# Patient Record
Sex: Male | Born: 1945 | Race: White | Hispanic: No | Marital: Married | Smoking: Former smoker
Health system: Southern US, Community
[De-identification: ages and names within clinical notes are randomized; demographics above are authoritative.]

## PROBLEM LIST (undated history)

## (undated) DIAGNOSIS — E039 Hypothyroidism, unspecified: Secondary | ICD-10-CM

## (undated) DIAGNOSIS — K219 Gastro-esophageal reflux disease without esophagitis: Secondary | ICD-10-CM

## (undated) HISTORY — PX: OTHER SURGICAL HISTORY: SHX169

---

## 2004-03-27 ENCOUNTER — Ambulatory Visit (HOSPITAL_COMMUNITY)
Admission: RE | Admit: 2004-03-27 | Discharge: 2004-03-27 | Payer: Self-pay | Admitting: Physical Medicine and Rehabilitation

## 2006-05-01 IMAGING — CR DG ORBITS FOR FOREIGN BODY
3 series · 3 of 3 positions shown · non-contrast
Comparison: none

CLINICAL DATA: Patient requires orbital clearance prior to MRI.

 ORBITS FOR FOREIGN BODY (TWO VIEWS):
 There is no evidence of metal or radiopaque foreign body within the orbits. No significant bone or soft tissue abnormalities are identified.

[view not recorded (1 of 3)]
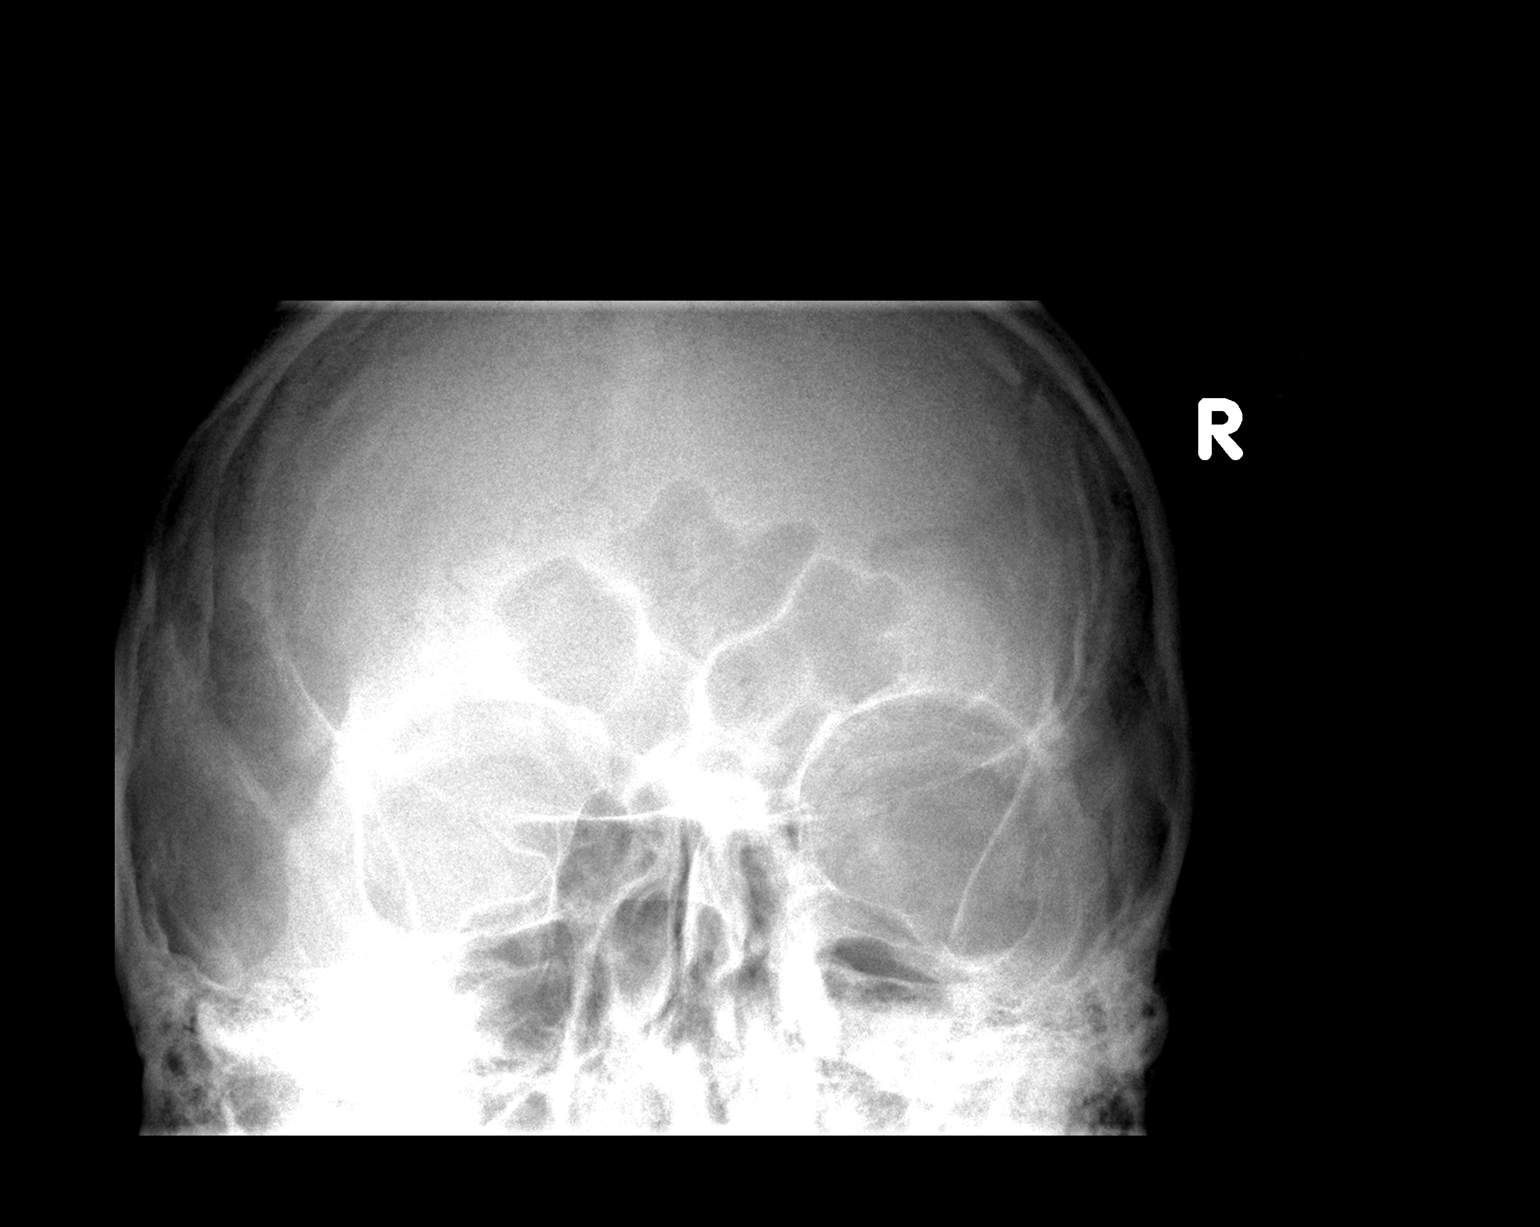

[view not recorded (2 of 3)]
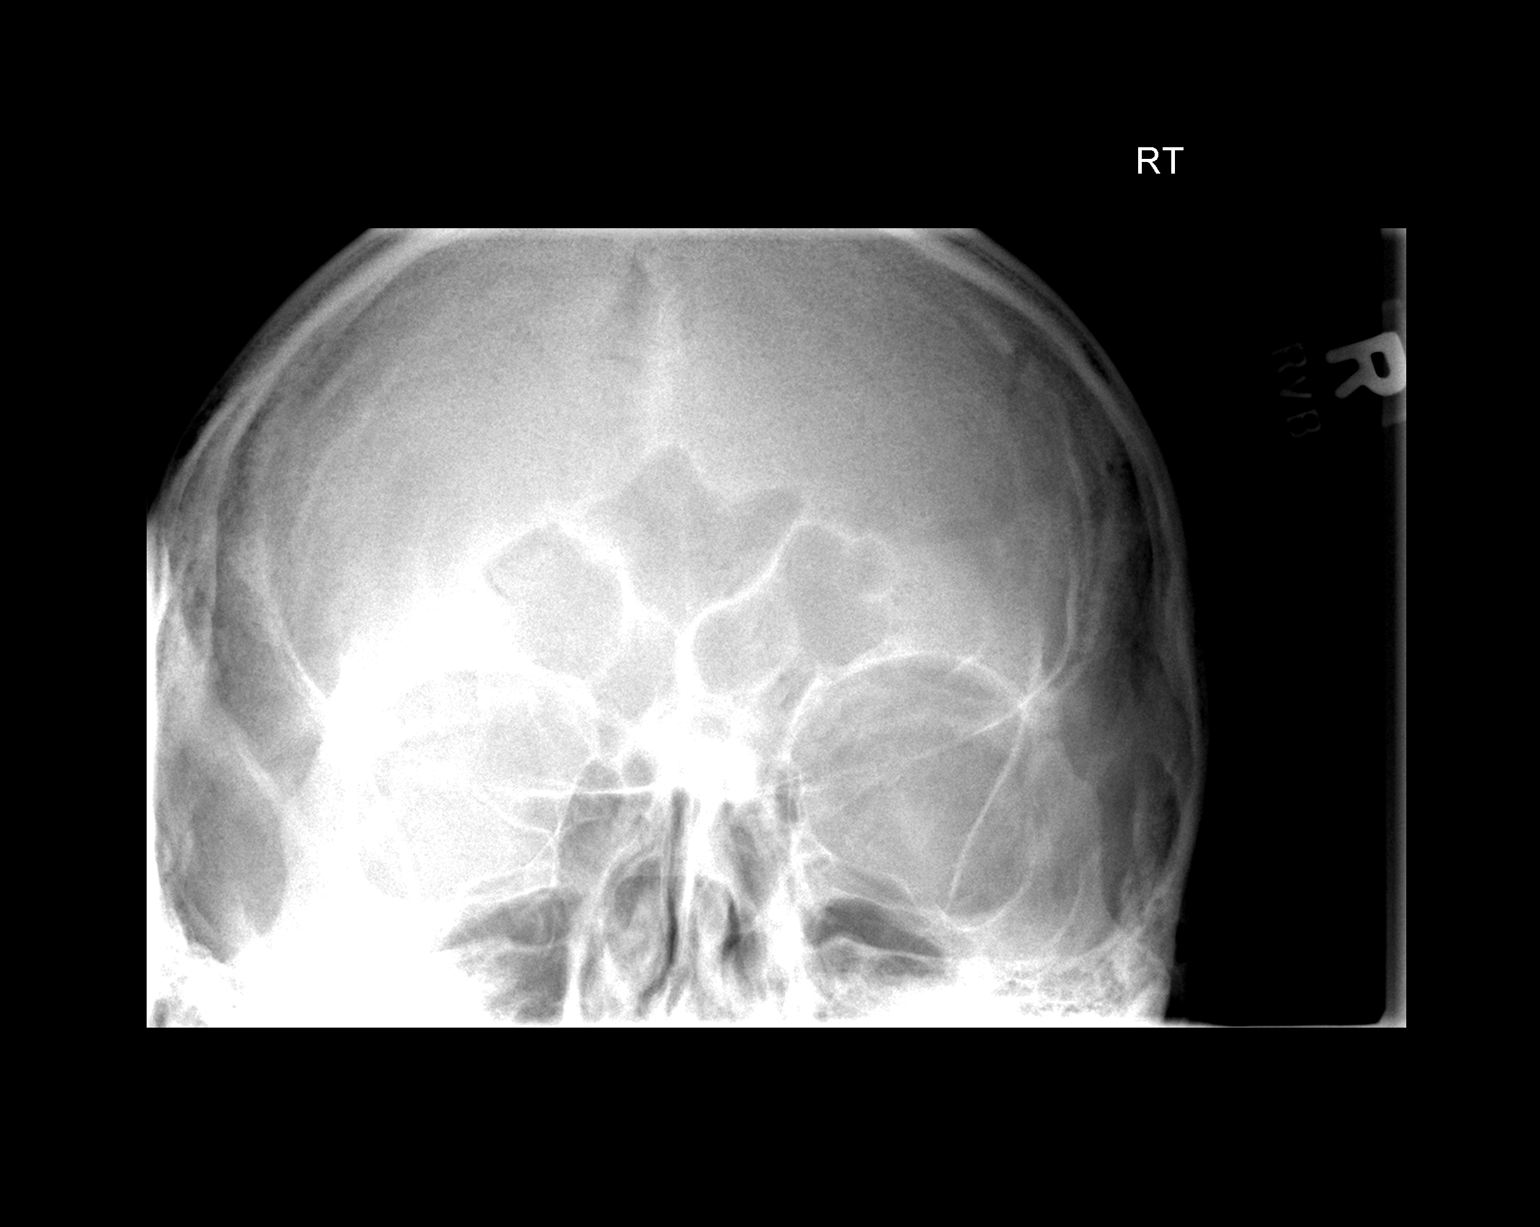

[view not recorded (3 of 3)]
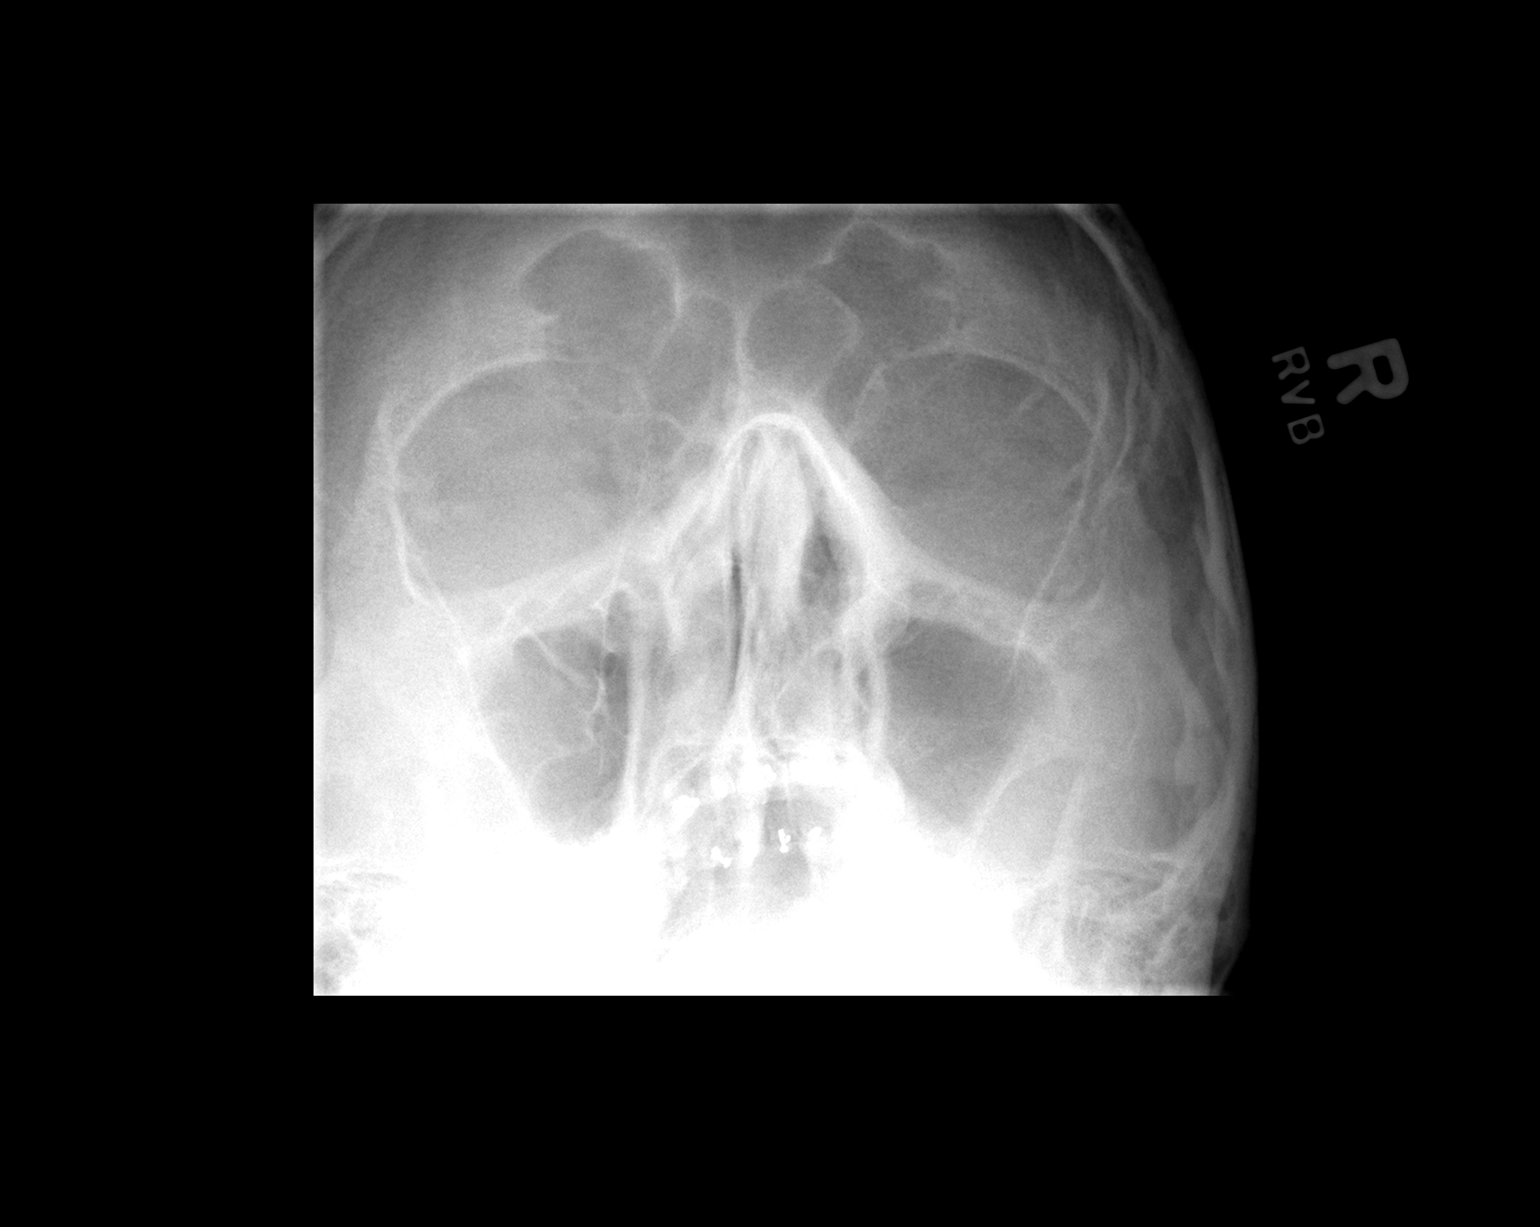

[3 of 3 positions shown; findings below may reference images not displayed]

IMPRESSION: No evidence of metallic foreign body.

## 2014-03-25 DIAGNOSIS — E559 Vitamin D deficiency, unspecified: Secondary | ICD-10-CM

## 2014-03-25 DIAGNOSIS — R059 Cough, unspecified: Secondary | ICD-10-CM

## 2014-03-25 DIAGNOSIS — M159 Polyosteoarthritis, unspecified: Secondary | ICD-10-CM

## 2014-03-25 DIAGNOSIS — Z8619 Personal history of other infectious and parasitic diseases: Secondary | ICD-10-CM

## 2014-03-25 DIAGNOSIS — G8929 Other chronic pain: Secondary | ICD-10-CM | POA: Insufficient documentation

## 2014-03-25 DIAGNOSIS — E039 Hypothyroidism, unspecified: Secondary | ICD-10-CM | POA: Insufficient documentation

## 2014-03-25 DIAGNOSIS — J019 Acute sinusitis, unspecified: Secondary | ICD-10-CM | POA: Insufficient documentation

## 2014-03-25 DIAGNOSIS — K219 Gastro-esophageal reflux disease without esophagitis: Secondary | ICD-10-CM | POA: Insufficient documentation

## 2014-11-04 DIAGNOSIS — G8929 Other chronic pain: Secondary | ICD-10-CM

## 2016-02-18 DIAGNOSIS — B9689 Other specified bacterial agents as the cause of diseases classified elsewhere: Secondary | ICD-10-CM | POA: Diagnosis not present

## 2016-02-18 DIAGNOSIS — J208 Acute bronchitis due to other specified organisms: Secondary | ICD-10-CM | POA: Diagnosis not present

## 2016-02-18 DIAGNOSIS — J309 Allergic rhinitis, unspecified: Secondary | ICD-10-CM | POA: Diagnosis not present

## 2016-02-18 DIAGNOSIS — R0982 Postnasal drip: Secondary | ICD-10-CM | POA: Diagnosis not present

## 2016-02-25 DIAGNOSIS — J329 Chronic sinusitis, unspecified: Secondary | ICD-10-CM | POA: Diagnosis not present

## 2016-07-28 DIAGNOSIS — H5203 Hypermetropia, bilateral: Secondary | ICD-10-CM | POA: Diagnosis not present

## 2016-11-10 DIAGNOSIS — J329 Chronic sinusitis, unspecified: Secondary | ICD-10-CM | POA: Insufficient documentation

## 2016-11-30 DIAGNOSIS — J302 Other seasonal allergic rhinitis: Secondary | ICD-10-CM | POA: Diagnosis not present

## 2016-11-30 DIAGNOSIS — E782 Mixed hyperlipidemia: Secondary | ICD-10-CM | POA: Diagnosis not present

## 2016-11-30 DIAGNOSIS — Z0000 Encounter for general adult medical examination without abnormal findings: Secondary | ICD-10-CM | POA: Diagnosis not present

## 2016-11-30 DIAGNOSIS — K219 Gastro-esophageal reflux disease without esophagitis: Secondary | ICD-10-CM | POA: Diagnosis not present

## 2016-11-30 DIAGNOSIS — E039 Hypothyroidism, unspecified: Secondary | ICD-10-CM | POA: Diagnosis not present

## 2016-11-30 DIAGNOSIS — Z1159 Encounter for screening for other viral diseases: Secondary | ICD-10-CM | POA: Diagnosis not present

## 2017-03-28 DIAGNOSIS — J329 Chronic sinusitis, unspecified: Secondary | ICD-10-CM | POA: Diagnosis not present

## 2017-07-25 DIAGNOSIS — H5203 Hypermetropia, bilateral: Secondary | ICD-10-CM | POA: Diagnosis not present

## 2017-12-01 DIAGNOSIS — Z1211 Encounter for screening for malignant neoplasm of colon: Secondary | ICD-10-CM | POA: Diagnosis not present

## 2017-12-01 DIAGNOSIS — Z125 Encounter for screening for malignant neoplasm of prostate: Secondary | ICD-10-CM | POA: Diagnosis not present

## 2017-12-01 DIAGNOSIS — Z87891 Personal history of nicotine dependence: Secondary | ICD-10-CM | POA: Diagnosis not present

## 2017-12-01 DIAGNOSIS — E782 Mixed hyperlipidemia: Secondary | ICD-10-CM

## 2017-12-01 DIAGNOSIS — J302 Other seasonal allergic rhinitis: Secondary | ICD-10-CM | POA: Diagnosis not present

## 2017-12-01 DIAGNOSIS — Z0000 Encounter for general adult medical examination without abnormal findings: Secondary | ICD-10-CM | POA: Diagnosis not present

## 2017-12-01 DIAGNOSIS — E039 Hypothyroidism, unspecified: Secondary | ICD-10-CM | POA: Diagnosis not present

## 2017-12-01 DIAGNOSIS — K219 Gastro-esophageal reflux disease without esophagitis: Secondary | ICD-10-CM | POA: Diagnosis not present

## 2017-12-26 DIAGNOSIS — J0100 Acute maxillary sinusitis, unspecified: Secondary | ICD-10-CM | POA: Diagnosis not present

## 2018-08-06 DIAGNOSIS — U071 COVID-19: Secondary | ICD-10-CM

## 2018-12-07 DIAGNOSIS — L821 Other seborrheic keratosis: Secondary | ICD-10-CM

## 2018-12-07 DIAGNOSIS — J302 Other seasonal allergic rhinitis: Secondary | ICD-10-CM | POA: Insufficient documentation

## 2019-12-11 DIAGNOSIS — K4090 Unilateral inguinal hernia, without obstruction or gangrene, not specified as recurrent: Secondary | ICD-10-CM | POA: Insufficient documentation

## 2020-02-04 DIAGNOSIS — E782 Mixed hyperlipidemia: Secondary | ICD-10-CM | POA: Diagnosis not present

## 2020-02-04 DIAGNOSIS — R972 Elevated prostate specific antigen [PSA]: Secondary | ICD-10-CM | POA: Diagnosis not present

## 2020-02-04 DIAGNOSIS — E039 Hypothyroidism, unspecified: Secondary | ICD-10-CM | POA: Diagnosis not present

## 2020-02-04 DIAGNOSIS — K219 Gastro-esophageal reflux disease without esophagitis: Secondary | ICD-10-CM | POA: Diagnosis not present

## 2020-02-17 DIAGNOSIS — R140 Abdominal distension (gaseous): Secondary | ICD-10-CM | POA: Diagnosis not present

## 2020-02-17 DIAGNOSIS — K219 Gastro-esophageal reflux disease without esophagitis: Secondary | ICD-10-CM | POA: Diagnosis not present

## 2020-02-17 DIAGNOSIS — K5909 Other constipation: Secondary | ICD-10-CM | POA: Diagnosis not present

## 2020-03-05 DIAGNOSIS — Z1211 Encounter for screening for malignant neoplasm of colon: Secondary | ICD-10-CM | POA: Diagnosis not present

## 2020-03-05 DIAGNOSIS — R1310 Dysphagia, unspecified: Secondary | ICD-10-CM | POA: Diagnosis not present

## 2020-03-16 DIAGNOSIS — R1310 Dysphagia, unspecified: Secondary | ICD-10-CM | POA: Diagnosis not present

## 2020-03-16 DIAGNOSIS — D126 Benign neoplasm of colon, unspecified: Secondary | ICD-10-CM | POA: Diagnosis not present

## 2020-03-16 DIAGNOSIS — K5730 Diverticulosis of large intestine without perforation or abscess without bleeding: Secondary | ICD-10-CM | POA: Diagnosis not present

## 2020-03-16 DIAGNOSIS — Z1211 Encounter for screening for malignant neoplasm of colon: Secondary | ICD-10-CM | POA: Diagnosis not present

## 2020-03-16 DIAGNOSIS — D120 Benign neoplasm of cecum: Secondary | ICD-10-CM | POA: Diagnosis not present

## 2020-08-13 DIAGNOSIS — B370 Candidal stomatitis: Secondary | ICD-10-CM | POA: Insufficient documentation

## 2021-03-30 DIAGNOSIS — Z0000 Encounter for general adult medical examination without abnormal findings: Secondary | ICD-10-CM | POA: Diagnosis not present

## 2021-03-30 DIAGNOSIS — Z125 Encounter for screening for malignant neoplasm of prostate: Secondary | ICD-10-CM | POA: Diagnosis not present

## 2021-03-30 DIAGNOSIS — J302 Other seasonal allergic rhinitis: Secondary | ICD-10-CM | POA: Diagnosis not present

## 2021-03-30 DIAGNOSIS — E782 Mixed hyperlipidemia: Secondary | ICD-10-CM | POA: Diagnosis not present

## 2021-03-30 DIAGNOSIS — K219 Gastro-esophageal reflux disease without esophagitis: Secondary | ICD-10-CM | POA: Diagnosis not present

## 2021-03-30 DIAGNOSIS — E039 Hypothyroidism, unspecified: Secondary | ICD-10-CM | POA: Diagnosis not present

## 2021-04-19 DIAGNOSIS — J301 Allergic rhinitis due to pollen: Secondary | ICD-10-CM | POA: Diagnosis not present

## 2021-04-28 DIAGNOSIS — J019 Acute sinusitis, unspecified: Secondary | ICD-10-CM | POA: Diagnosis not present

## 2021-04-28 DIAGNOSIS — J029 Acute pharyngitis, unspecified: Secondary | ICD-10-CM | POA: Diagnosis not present

## 2021-07-02 DIAGNOSIS — S80861A Insect bite (nonvenomous), right lower leg, initial encounter: Secondary | ICD-10-CM | POA: Diagnosis not present

## 2021-08-05 DIAGNOSIS — S80861A Insect bite (nonvenomous), right lower leg, initial encounter: Secondary | ICD-10-CM | POA: Diagnosis not present

## 2021-12-10 DIAGNOSIS — J069 Acute upper respiratory infection, unspecified: Secondary | ICD-10-CM | POA: Diagnosis not present

## 2022-01-05 DIAGNOSIS — J069 Acute upper respiratory infection, unspecified: Secondary | ICD-10-CM | POA: Diagnosis not present

## 2022-01-05 DIAGNOSIS — J349 Unspecified disorder of nose and nasal sinuses: Secondary | ICD-10-CM | POA: Diagnosis not present

## 2022-02-02 DIAGNOSIS — K4090 Unilateral inguinal hernia, without obstruction or gangrene, not specified as recurrent: Secondary | ICD-10-CM | POA: Diagnosis not present

## 2022-02-11 DIAGNOSIS — Z79899 Other long term (current) drug therapy: Secondary | ICD-10-CM | POA: Diagnosis not present

## 2022-02-11 DIAGNOSIS — K4090 Unilateral inguinal hernia, without obstruction or gangrene, not specified as recurrent: Secondary | ICD-10-CM | POA: Diagnosis not present

## 2022-02-11 DIAGNOSIS — E039 Hypothyroidism, unspecified: Secondary | ICD-10-CM | POA: Diagnosis not present

## 2022-02-11 DIAGNOSIS — G8918 Other acute postprocedural pain: Secondary | ICD-10-CM | POA: Diagnosis not present

## 2022-02-11 DIAGNOSIS — K219 Gastro-esophageal reflux disease without esophagitis: Secondary | ICD-10-CM | POA: Diagnosis not present

## 2022-03-02 DIAGNOSIS — Z09 Encounter for follow-up examination after completed treatment for conditions other than malignant neoplasm: Secondary | ICD-10-CM | POA: Insufficient documentation

## 2022-03-22 DIAGNOSIS — E782 Mixed hyperlipidemia: Secondary | ICD-10-CM | POA: Diagnosis not present

## 2022-03-22 DIAGNOSIS — Z125 Encounter for screening for malignant neoplasm of prostate: Secondary | ICD-10-CM | POA: Diagnosis not present

## 2022-03-22 DIAGNOSIS — E039 Hypothyroidism, unspecified: Secondary | ICD-10-CM | POA: Diagnosis not present

## 2022-03-22 DIAGNOSIS — K219 Gastro-esophageal reflux disease without esophagitis: Secondary | ICD-10-CM | POA: Diagnosis not present

## 2022-03-22 DIAGNOSIS — H6123 Impacted cerumen, bilateral: Secondary | ICD-10-CM | POA: Diagnosis not present

## 2022-04-13 DIAGNOSIS — L299 Pruritus, unspecified: Secondary | ICD-10-CM | POA: Diagnosis not present

## 2022-04-13 DIAGNOSIS — L820 Inflamed seborrheic keratosis: Secondary | ICD-10-CM | POA: Diagnosis not present

## 2022-04-13 DIAGNOSIS — L814 Other melanin hyperpigmentation: Secondary | ICD-10-CM | POA: Diagnosis not present

## 2022-06-02 DIAGNOSIS — I2089 Other forms of angina pectoris: Secondary | ICD-10-CM | POA: Diagnosis not present

## 2022-06-02 DIAGNOSIS — K219 Gastro-esophageal reflux disease without esophagitis: Secondary | ICD-10-CM | POA: Diagnosis not present

## 2022-06-02 DIAGNOSIS — R9431 Abnormal electrocardiogram [ECG] [EKG]: Secondary | ICD-10-CM | POA: Diagnosis not present

## 2022-06-02 DIAGNOSIS — R1013 Epigastric pain: Secondary | ICD-10-CM | POA: Diagnosis not present

## 2022-06-02 DIAGNOSIS — Z7982 Long term (current) use of aspirin: Secondary | ICD-10-CM | POA: Diagnosis not present

## 2022-06-02 DIAGNOSIS — M1990 Unspecified osteoarthritis, unspecified site: Secondary | ICD-10-CM | POA: Diagnosis not present

## 2022-06-02 DIAGNOSIS — Z87891 Personal history of nicotine dependence: Secondary | ICD-10-CM | POA: Diagnosis not present

## 2022-06-02 DIAGNOSIS — E039 Hypothyroidism, unspecified: Secondary | ICD-10-CM | POA: Diagnosis not present

## 2022-06-02 DIAGNOSIS — Z79899 Other long term (current) drug therapy: Secondary | ICD-10-CM | POA: Diagnosis not present

## 2022-06-02 DIAGNOSIS — E785 Hyperlipidemia, unspecified: Secondary | ICD-10-CM | POA: Diagnosis not present

## 2022-06-02 DIAGNOSIS — R079 Chest pain, unspecified: Secondary | ICD-10-CM | POA: Diagnosis not present

## 2022-06-02 DIAGNOSIS — I493 Ventricular premature depolarization: Secondary | ICD-10-CM | POA: Diagnosis not present

## 2022-06-03 ENCOUNTER — Inpatient Hospital Stay (HOSPITAL_COMMUNITY)
Admission: AD | Admit: 2022-06-03 | Discharge: 2022-06-05 | DRG: 322 | Disposition: A | Discharge status: Home / Self Care | Payer: Medicare HMO | Source: Other Acute Inpatient Hospital | Attending: Cardiology | Admitting: Cardiology

## 2022-06-03 ENCOUNTER — Encounter (HOSPITAL_COMMUNITY)
Admission: AD | Disposition: A | Discharge status: Home / Self Care | Payer: Self-pay | Source: Other Acute Inpatient Hospital | Attending: Cardiology

## 2022-06-03 ENCOUNTER — Encounter (HOSPITAL_COMMUNITY): Payer: Self-pay | Admitting: Cardiology

## 2022-06-03 DIAGNOSIS — I472 Ventricular tachycardia, unspecified: Secondary | ICD-10-CM | POA: Diagnosis not present

## 2022-06-03 DIAGNOSIS — Z7982 Long term (current) use of aspirin: Secondary | ICD-10-CM | POA: Diagnosis not present

## 2022-06-03 DIAGNOSIS — N183 Chronic kidney disease, stage 3 unspecified: Secondary | ICD-10-CM | POA: Diagnosis not present

## 2022-06-03 DIAGNOSIS — E039 Hypothyroidism, unspecified: Secondary | ICD-10-CM | POA: Diagnosis present

## 2022-06-03 DIAGNOSIS — R0789 Other chest pain: Secondary | ICD-10-CM | POA: Diagnosis not present

## 2022-06-03 DIAGNOSIS — R1013 Epigastric pain: Secondary | ICD-10-CM | POA: Diagnosis not present

## 2022-06-03 DIAGNOSIS — I951 Orthostatic hypotension: Secondary | ICD-10-CM | POA: Diagnosis not present

## 2022-06-03 DIAGNOSIS — Z955 Presence of coronary angioplasty implant and graft: Principal | ICD-10-CM

## 2022-06-03 DIAGNOSIS — I200 Unstable angina: Secondary | ICD-10-CM

## 2022-06-03 DIAGNOSIS — R079 Chest pain, unspecified: Secondary | ICD-10-CM | POA: Diagnosis not present

## 2022-06-03 DIAGNOSIS — R9439 Abnormal result of other cardiovascular function study: Secondary | ICD-10-CM | POA: Diagnosis present

## 2022-06-03 DIAGNOSIS — R55 Syncope and collapse: Secondary | ICD-10-CM

## 2022-06-03 DIAGNOSIS — Z79899 Other long term (current) drug therapy: Secondary | ICD-10-CM | POA: Diagnosis not present

## 2022-06-03 DIAGNOSIS — K219 Gastro-esophageal reflux disease without esophagitis: Secondary | ICD-10-CM | POA: Diagnosis present

## 2022-06-03 DIAGNOSIS — I209 Angina pectoris, unspecified: Secondary | ICD-10-CM | POA: Diagnosis not present

## 2022-06-03 DIAGNOSIS — E785 Hyperlipidemia, unspecified: Secondary | ICD-10-CM | POA: Diagnosis not present

## 2022-06-03 DIAGNOSIS — I255 Ischemic cardiomyopathy: Secondary | ICD-10-CM | POA: Diagnosis not present

## 2022-06-03 DIAGNOSIS — I25110 Atherosclerotic heart disease of native coronary artery with unstable angina pectoris: Secondary | ICD-10-CM | POA: Diagnosis not present

## 2022-06-03 DIAGNOSIS — Z87891 Personal history of nicotine dependence: Secondary | ICD-10-CM

## 2022-06-03 DIAGNOSIS — I493 Ventricular premature depolarization: Secondary | ICD-10-CM | POA: Diagnosis present

## 2022-06-03 DIAGNOSIS — M1990 Unspecified osteoarthritis, unspecified site: Secondary | ICD-10-CM | POA: Diagnosis not present

## 2022-06-03 DIAGNOSIS — Z8249 Family history of ischemic heart disease and other diseases of the circulatory system: Secondary | ICD-10-CM

## 2022-06-03 DIAGNOSIS — I2089 Other forms of angina pectoris: Secondary | ICD-10-CM | POA: Diagnosis not present

## 2022-06-03 HISTORY — PX: CORONARY STENT INTERVENTION: CATH118234

## 2022-06-03 HISTORY — PX: LEFT HEART CATH AND CORONARY ANGIOGRAPHY: CATH118249

## 2022-06-03 HISTORY — DX: Gastro-esophageal reflux disease without esophagitis: K21.9

## 2022-06-03 HISTORY — DX: Hypothyroidism, unspecified: E03.9

## 2022-06-03 LAB — CBC
HCT: 40.5 % (ref 39.0–52.0)
Hemoglobin: 13.8 g/dL (ref 13.0–17.0)
MCH: 29.1 pg (ref 26.0–34.0)
MCHC: 34.1 g/dL (ref 30.0–36.0)
MCV: 85.4 fL (ref 80.0–100.0)
Platelets: 213 10*3/uL (ref 150–400)
RBC: 4.74 MIL/uL (ref 4.22–5.81)
RDW: 11.7 % (ref 11.5–15.5)
WBC: 8 10*3/uL (ref 4.0–10.5)
nRBC: 0 % (ref 0.0–0.2)

## 2022-06-03 LAB — CREATININE, SERUM
Creatinine, Ser: 1.25 mg/dL — ABNORMAL HIGH (ref 0.61–1.24)
GFR, Estimated: >60 mL/min (ref >60)

## 2022-06-03 LAB — POCT ACTIVATED CLOTTING TIME: Activated Clotting Time: 309 seconds

## 2022-06-03 LAB — GLUCOSE, CAPILLARY: Glucose-Capillary: 84 mg/dL (ref 70–99)

## 2022-06-03 SURGERY — LEFT HEART CATH AND CORONARY ANGIOGRAPHY
Anesthesia: LOCAL

## 2022-06-03 MED ORDER — MIDAZOLAM HCL 2 MG/2ML IJ SOLN
INTRAMUSCULAR | Status: AC
  Filled 2022-06-03: qty 2

## 2022-06-03 MED ORDER — LIP MEDEX EX OINT
TOPICAL_OINTMENT | CUTANEOUS | Status: DC | PRN
  Filled 2022-06-03: qty 7

## 2022-06-03 MED ORDER — SODIUM CHLORIDE 0.9% FLUSH
3.0000 mL | Freq: Two times a day (BID) | INTRAVENOUS | Status: DC
  Administered 2022-06-04 – 2022-06-05 (×2): 3 mL via INTRAVENOUS

## 2022-06-03 MED ORDER — ENOXAPARIN SODIUM 40 MG/0.4ML IJ SOSY
40.0000 mg | PREFILLED_SYRINGE | INTRAMUSCULAR | Status: DC
  Administered 2022-06-04: 40 mg via SUBCUTANEOUS
  Filled 2022-06-03: qty 0.4

## 2022-06-03 MED ORDER — SODIUM CHLORIDE 0.9% FLUSH: 3.0000 mL | INTRAVENOUS | Status: DC | PRN

## 2022-06-03 MED ORDER — TICAGRELOR 90 MG PO TABS
ORAL_TABLET | ORAL | Status: DC | PRN
  Administered 2022-06-03: 180 mg via ORAL

## 2022-06-03 MED ORDER — ONDANSETRON HCL 4 MG/2ML IJ SOLN: 4.0000 mg | Freq: Four times a day (QID) | INTRAMUSCULAR | Status: DC | PRN

## 2022-06-03 MED ORDER — ACETAMINOPHEN 325 MG PO TABS: 650.0000 mg | ORAL_TABLET | ORAL | Status: DC | PRN

## 2022-06-03 MED ORDER — NITROGLYCERIN 0.4 MG SL SUBL: 0.4000 mg | SUBLINGUAL_TABLET | SUBLINGUAL | Status: DC | PRN

## 2022-06-03 MED ORDER — TICAGRELOR 90 MG PO TABS
90.0000 mg | ORAL_TABLET | Freq: Two times a day (BID) | ORAL | Status: DC
  Administered 2022-06-04 – 2022-06-05 (×3): 90 mg via ORAL
  Filled 2022-06-03 (×3): qty 1

## 2022-06-03 MED ORDER — TICAGRELOR 90 MG PO TABS
ORAL_TABLET | ORAL | Status: AC
  Filled 2022-06-03: qty 2

## 2022-06-03 MED ORDER — MIDAZOLAM HCL 2 MG/2ML IJ SOLN
INTRAMUSCULAR | Status: DC | PRN
  Administered 2022-06-03: 1 mg via INTRAVENOUS

## 2022-06-03 MED ORDER — METOPROLOL SUCCINATE ER 25 MG PO TB24
25.0000 mg | ORAL_TABLET | Freq: Every day | ORAL | Status: DC
  Administered 2022-06-03 – 2022-06-04 (×2): 25 mg via ORAL
  Filled 2022-06-03 (×2): qty 1

## 2022-06-03 MED ORDER — VERAPAMIL HCL 2.5 MG/ML IV SOLN
INTRAVENOUS | Status: DC | PRN
  Administered 2022-06-03: 10 mL via INTRA_ARTERIAL

## 2022-06-03 MED ORDER — NITROGLYCERIN 1 MG/10 ML FOR IR/CATH LAB
INTRA_ARTERIAL | Status: AC
  Filled 2022-06-03: qty 10

## 2022-06-03 MED ORDER — ASPIRIN 81 MG PO TBEC
81.0000 mg | DELAYED_RELEASE_TABLET | Freq: Every day | ORAL | Status: DC
  Administered 2022-06-04 – 2022-06-05 (×2): 81 mg via ORAL
  Filled 2022-06-03 (×2): qty 1

## 2022-06-03 MED ORDER — SODIUM CHLORIDE 0.9 % IV SOLN: 250.0000 mL | INTRAVENOUS | Status: DC | PRN

## 2022-06-03 MED ORDER — ASPIRIN 81 MG PO CHEW: 81.0000 mg | CHEWABLE_TABLET | Freq: Every day | ORAL | Status: DC

## 2022-06-03 MED ORDER — SODIUM CHLORIDE 0.9 % WEIGHT BASED INFUSION
1.0000 mL/kg/h | INTRAVENOUS | Status: AC
  Administered 2022-06-03: 1 mL/kg/h via INTRAVENOUS

## 2022-06-03 MED ORDER — HEPARIN SODIUM (PORCINE) 1000 UNIT/ML IJ SOLN
INTRAMUSCULAR | Status: AC
  Filled 2022-06-03: qty 10

## 2022-06-03 MED ORDER — HEPARIN SODIUM (PORCINE) 1000 UNIT/ML IJ SOLN
INTRAMUSCULAR | Status: DC | PRN
  Administered 2022-06-03 (×2): 3500 [IU] via INTRAVENOUS

## 2022-06-03 MED ORDER — FENTANYL CITRATE (PF) 100 MCG/2ML IJ SOLN
INTRAMUSCULAR | Status: AC
  Filled 2022-06-03: qty 2

## 2022-06-03 MED ORDER — IOHEXOL 350 MG/ML SOLN
INTRAVENOUS | Status: DC | PRN
  Administered 2022-06-03: 100 mL

## 2022-06-03 MED ORDER — LIDOCAINE HCL (PF) 1 % IJ SOLN
INTRAMUSCULAR | Status: DC | PRN
  Administered 2022-06-03: 2 mL

## 2022-06-03 MED ORDER — FENTANYL CITRATE (PF) 100 MCG/2ML IJ SOLN
INTRAMUSCULAR | Status: DC | PRN
  Administered 2022-06-03: 25 ug via INTRAVENOUS

## 2022-06-03 MED ORDER — NITROGLYCERIN 1 MG/10 ML FOR IR/CATH LAB
INTRA_ARTERIAL | Status: DC | PRN
  Administered 2022-06-03: 200 ug via INTRACORONARY

## 2022-06-03 MED ORDER — VERAPAMIL HCL 2.5 MG/ML IV SOLN
INTRAVENOUS | Status: AC
  Filled 2022-06-03: qty 6

## 2022-06-03 MED ORDER — LIDOCAINE HCL (PF) 1 % IJ SOLN
INTRAMUSCULAR | Status: AC
  Filled 2022-06-03: qty 30

## 2022-06-03 MED ORDER — HEPARIN (PORCINE) IN NACL 1000-0.9 UT/500ML-% IV SOLN
INTRAVENOUS | Status: DC | PRN
  Administered 2022-06-03 (×2): 500 mL

## 2022-06-03 MED ORDER — ATORVASTATIN CALCIUM 80 MG PO TABS
80.0000 mg | ORAL_TABLET | Freq: Every day | ORAL | Status: DC
  Administered 2022-06-03 – 2022-06-05 (×3): 80 mg via ORAL
  Filled 2022-06-03 (×3): qty 1

## 2022-06-03 SURGICAL SUPPLY — 18 items
BALLN EMERGE MR 2.5X12 (BALLOONS) ×1
BALLN ~~LOC~~ EMERGE MR 3.0X8 (BALLOONS) ×1
BALLOON EMERGE MR 2.5X12 (BALLOONS) IMPLANT
BALLOON ~~LOC~~ EMERGE MR 3.0X8 (BALLOONS) IMPLANT
CATH 5FR JL3.5 JR4 ANG PIG MP (CATHETERS) IMPLANT
CATH VISTA GUIDE 6FR XBLAD3.5 (CATHETERS) IMPLANT
DEVICE RAD TR BAND REGULAR (VASCULAR PRODUCTS) IMPLANT
GLIDESHEATH SLEND SS 6F .021 (SHEATH) IMPLANT
GUIDEWIRE INQWIRE 1.5J.035X260 (WIRE) IMPLANT
INQWIRE 1.5J .035X260CM (WIRE) ×1
KIT ENCORE 26 ADVANTAGE (KITS) IMPLANT
KIT HEART LEFT (KITS) ×1 IMPLANT
PACK CARDIAC CATHETERIZATION (CUSTOM PROCEDURE TRAY) ×1 IMPLANT
STENT SYNERGY XD 3.0X12 (Permanent Stent) IMPLANT
SYNERGY XD 3.0X12 (Permanent Stent) ×1 IMPLANT
TRANSDUCER W/STOPCOCK (MISCELLANEOUS) ×1 IMPLANT
TUBING CIL FLEX 10 FLL-RA (TUBING) ×1 IMPLANT
WIRE ASAHI PROWATER 180CM (WIRE) IMPLANT

## 2022-06-03 NOTE — Significant Event (Signed)
Rapid Response Event Note   Reason for Call :  Syncopy while in bathroom.  Per RN, pt walked to the bathroom, urinated, then got dizzy and passed out. Pt was lowered to the floor. Per RN, he did not fall or hit his head.  Initial Focused Assessment:  Pt lying in bed in trendelenburg, in no visible distress. He is neuro intact. Pt denies CP/SOB but does c/o being hot and dizzy. Pupils 3, equal, and brisk. Lungs clear t/o. Skin cool to touch  VS once back in bed: HR-64, BP-118/64, RR-18, 99% on RA  Telemetry reviewed. Pt became bradycardic with HR-30(SB) during episode.   Interventions:  CBG-84 EKG-NSR, nonspecific T wave abnormality. Plan of Care:  ?orthostatic hypotension vs vagal? Pt currently back to baseline. Await MD call back. Please call RRT if further assistance needed.    Event Summary:   MD Notified: Dr. Hector Brunswick notified by bedside RN. Call Time:2334 Arrival [id number] End [id number]  Terrilyn Saver, RN

## 2022-06-03 NOTE — H&P (Signed)
Cardiology Admission History and Physical   Patient ID: Gregory Gibbs MRN: [id number]; DOB: Jul 04, 1945   Admission date: 06/03/2022  PCP:  Keturah Barre, MD   [facility] Providers Cardiologist:  [facility] to [facility] (seen by Dr.  Rosezella Florida at [place])   Chief Complaint:  Exertional chest pain  Patient Profile:   Gregory Gibbs is a 77 y.o. male with hypothyroidism and GERD who is being transferred from [facility] on 06/03/2022 for exertional chest pain with positive treadmill myoview.  History of Present Illness:   Gregory Gibbs is a 77 year old male with past medical history of hypothyroidism and GERD who presented to urgent care on 5/30 with complaint of epigastric burning sensation.  Symptom has been occurring intermittently for the past 8 days with exertion and relieved by rest.  He also endorses worsening dyspnea on exertion.  Symptoms feels different from his usual acid reflux symptom.  He was subsequently sent to [facility].  Initial chest x-ray was normal.  Blood pressure on arrival 135/74.  O2 saturation 98% on room air.  Pulse 71 bpm.  Blood work showed white blood cell count 6.4, hemoglobin 13.9, platelet 280.  Sodium 142, potassium 4.2, creatinine 1.20.  EKG showed sinus rhythm with nonspecific changes.  Initial troponin I < 0.01, second troponin 0.02.  proBNP 610.  Symptom was concerning for angina.  Subsequent treadmill exercise stress test obtained on 06/03/2022 revealed possible EKG changes with ST depression and a downsloping in the inferolateral leads, moderate size region of reversible ischemia in the anterior septal wall, EF 45%, overall high risk study.  Cardiology service was consulted.  Patient was seen by Dr. Rosezella Florida on 5/31, patient was suspected to have LAD lesion.  Given typical anginal pattern, he was transferred to [facility] for cardiac catheterization.  Lipid panel obtained at [facility] showed a total  cholesterol 199, triglyceride 118, HDL 46, LDL 129.  Note, creatinine did increase to 1.4 on 06/03/2022.   Past Medical History:  Diagnosis Date   GERD (gastroesophageal reflux disease)    Hypothyroidism     Past Surgical History:  Procedure Laterality Date   left inguinal hernia repair Left    2024     Medications Prior to Admission: Prior to Admission medications   Not on File     Allergies:   No Known Allergies  Social History:   Social History   Socioeconomic History   Marital status: Married    Spouse name: Not on file   Number of children: Not on file   Years of education: Not on file   Highest education level: Not on file  Occupational History   Not on file  Tobacco Use   Smoking status: Former    Types: Cigarettes   Smokeless tobacco: Never   Tobacco comments:    Quit in 1977, smoked a total of 12 years  Substance and Sexual Activity   Alcohol use: Never   Drug use: Never   Sexual activity: Not on file  Other Topics Concern   Not on file  Social History Narrative   Not on file   Social Determinants of Health   Financial Resource Strain: Not on file  Food Insecurity: Not on file  Transportation Needs: Not on file  Physical Activity: Not on file  Stress: Not on file  Social Connections: Not on file  Intimate Partner Violence: Not on file    Family History:   The patient's family history includes  Atrial fibrillation in his sister; Dementia in his father; Heart failure in his mother.    ROS:  Please see the history of present illness.  All other ROS reviewed and negative.     Physical Exam/Data:  There were no vitals filed for this visit. No intake or output data in the 24 hours ending 06/03/22 1653     No data to display           There is no height or weight on file to calculate BMI.  General:  Well nourished, well developed, in no acute distress HEENT: normal Neck: no JVD Vascular: No carotid bruits; Distal pulses 2+ bilaterally    Cardiac:  normal S1, S2; RRR; no murmur  Lungs:  clear to auscultation bilaterally, no wheezing, rhonchi or rales  Abd: soft, nontender, no hepatomegaly  Ext: no edema Musculoskeletal:  No deformities, BUE and BLE strength normal and equal Skin: warm and dry  Neuro:  CNs 2-12 intact, no focal abnormalities noted Psych:  Normal affect    EKG:  The ECG that was done and was personally reviewed and demonstrates NSR with poor R wave progression in the anterior leads  Relevant CV Studies:  Treadmill myoview 06/03/2022 at [facility] ST depression in the inferolateral leads with stresss, perfusion study showed moderate size region of reversible ischemia in the anterior septal wall, EF 45%, overall high risk study  Laboratory Data:  High Sensitivity Troponin:  No results for input(s): "TROPONINIHS" in the last 720 hours.    ChemistryNo results for input(s): "NA", "K", "CL", "CO2", "GLUCOSE", "BUN", "CREATININE", "CALCIUM", "MG", "GFRNONAA", "GFRAA", "ANIONGAP" in the last 168 hours.  No results for input(s): "PROT", "ALBUMIN", "AST", "ALT", "ALKPHOS", "BILITOT" in the last 168 hours. Lipids No results for input(s): "CHOL", "TRIG", "HDL", "LABVLDL", "LDLCALC", "CHOLHDL" in the last 168 hours. HematologyNo results for input(s): "WBC", "RBC", "HGB", "HCT", "MCV", "MCH", "MCHC", "RDW", "PLT" in the last 168 hours. Thyroid No results for input(s): "TSH", "FREET4" in the last 168 hours. BNPNo results for input(s): "BNP", "PROBNP" in the last 168 hours.  DDimer No results for input(s): "DDIMER" in the last 168 hours.   Radiology/Studies:  No results found.   Assessment and Plan:   Exertional chest pain  - 2 week onset of exertional chest pain, Serial trop < 0.01 --> 0.02 --> 0.02  - treadmill myoview on 06/03/2022 at [facility] showed ST depression in the inferolateral leads with stresss, perfusion study showed moderate size region of reversible ischemia in the anterior septal  wall, EF 45%, overall high risk study  - plan to proceed with cardiac catheterization at [facility]  Hyperlipidemia: total cholesterol 199, triglyceride 118, HDL 46, LDL 129 at [place], high intensity statin.    CKD stage III: Cr 1.20 --> 1.40. Hydrate, repeat BMET in AM   Risk Assessment/Risk Scores:    TIMI Risk Score for Unstable Angina or Non-ST Elevation MI:   The patient's TIMI risk score is 4, which indicates a 20% risk of all cause mortality, new or recurrent myocardial infarction or need for urgent revascularization in the next 14 days.       Severity of Illness: The appropriate patient status for this patient is OBSERVATION. Observation status is judged to be reasonable and necessary in order to provide the required intensity of service to ensure the patient's safety. The patient's presenting symptoms, physical exam findings, and initial radiographic and laboratory data in the context of their medical condition is felt to place them at decreased  risk for further clinical deterioration. Furthermore, it is anticipated that the patient will be medically stable for discharge from the hospital within 2 midnights of admission.    For questions or updates, please contact [facility] Please consult [email or web address] for contact info under     Ramond Dial, [place]  06/03/2022 4:53 PM

## 2022-06-04 ENCOUNTER — Inpatient Hospital Stay (HOSPITAL_COMMUNITY): Payer: Medicare HMO

## 2022-06-04 DIAGNOSIS — R55 Syncope and collapse: Secondary | ICD-10-CM

## 2022-06-04 DIAGNOSIS — I200 Unstable angina: Secondary | ICD-10-CM | POA: Diagnosis not present

## 2022-06-04 DIAGNOSIS — R079 Chest pain, unspecified: Secondary | ICD-10-CM

## 2022-06-04 HISTORY — DX: Syncope and collapse: R55

## 2022-06-04 LAB — CBC
HCT: 40.6 % (ref 39.0–52.0)
Hemoglobin: 13.9 g/dL (ref 13.0–17.0)
MCH: 29.9 pg (ref 26.0–34.0)
MCHC: 34.2 g/dL (ref 30.0–36.0)
MCV: 87.3 fL (ref 80.0–100.0)
Platelets: 217 10*3/uL (ref 150–400)
RBC: 4.65 MIL/uL (ref 4.22–5.81)
RDW: 11.9 % (ref 11.5–15.5)
WBC: 10.4 10*3/uL (ref 4.0–10.5)
nRBC: 0 % (ref 0.0–0.2)

## 2022-06-04 LAB — BASIC METABOLIC PANEL
Anion gap: 8 (ref 5–15)
BUN: 13 mg/dL (ref 8–23)
CO2: 24 mmol/L (ref 22–32)
Calcium: 8.6 mg/dL — ABNORMAL LOW (ref 8.9–10.3)
Chloride: 105 mmol/L (ref 98–111)
Creatinine, Ser: 1.26 mg/dL — ABNORMAL HIGH (ref 0.61–1.24)
GFR, Estimated: 59 mL/min — ABNORMAL LOW (ref >60)
Glucose, Bld: 83 mg/dL (ref 70–99)
Potassium: 3.8 mmol/L (ref 3.5–5.1)
Sodium: 137 mmol/L (ref 135–145)

## 2022-06-04 LAB — LIPID PANEL
Cholesterol: 215 mg/dL — ABNORMAL HIGH (ref 0–200)
HDL: 49 mg/dL (ref >40)
LDL Cholesterol: 149 mg/dL — ABNORMAL HIGH (ref 0–99)
Total CHOL/HDL Ratio: 4.4 RATIO
Triglycerides: 87 mg/dL (ref <150)
VLDL: 17 mg/dL (ref 0–40)

## 2022-06-04 LAB — ECHOCARDIOGRAM COMPLETE
Area-P 1/2: 3.48 cm2
Calc EF: 48.6 %
S' Lateral: 3.7 cm
Single Plane A2C EF: 47.5 %
Single Plane A4C EF: 50.5 %
Weight: 2496 oz

## 2022-06-04 NOTE — Plan of Care (Signed)
  Problem: Education: Goal: Understanding of CV disease, CV risk reduction, and recovery process will improve Outcome: Progressing Goal: Individualized Educational Video(s) Outcome: Progressing   Problem: Activity: Goal: Ability to return to baseline activity level will improve Outcome: Progressing   Problem: Cardiovascular: Goal: Ability to achieve and maintain adequate cardiovascular perfusion will improve Outcome: Progressing Goal: Vascular access site(s) Level 0-1 will be maintained Outcome: Progressing   Problem: Health Behavior/Discharge Planning: Goal: Ability to safely manage health-related needs after discharge will improve Outcome: Progressing   Problem: Education: Goal: Understanding of cardiac disease, CV risk reduction, and recovery process will improve Outcome: Progressing Goal: Individualized Educational Video(s) Outcome: Progressing   Problem: Activity: Goal: Ability to tolerate increased activity will improve Outcome: Progressing   Problem: Cardiac: Goal: Ability to achieve and maintain adequate cardiovascular perfusion will improve Outcome: Progressing   Problem: Health Behavior/Discharge Planning: Goal: Ability to safely manage health-related needs after discharge will improve Outcome: Progressing   Problem: Education: Goal: Knowledge of General Education information will improve Description: Including pain rating scale, medication(s)/side effects and non-pharmacologic comfort measures Outcome: Progressing   Problem: Health Behavior/Discharge Planning: Goal: Ability to manage health-related needs will improve Outcome: Progressing   Problem: Clinical Measurements: Goal: Ability to maintain clinical measurements within normal limits will improve Outcome: Progressing Goal: Will remain free from infection Outcome: Progressing Goal: Diagnostic test results will improve Outcome: Progressing Goal: Respiratory complications will improve Outcome:  Progressing Goal: Cardiovascular complication will be avoided Outcome: Progressing   Problem: Activity: Goal: Risk for activity intolerance will decrease Outcome: Progressing   Problem: Nutrition: Goal: Adequate nutrition will be maintained Outcome: Progressing   Problem: Coping: Goal: Level of anxiety will decrease Outcome: Progressing   Problem: Elimination: Goal: Will not experience complications related to bowel motility Outcome: Progressing Goal: Will not experience complications related to urinary retention Outcome: Progressing   Problem: Pain Managment: Goal: General experience of comfort will improve Outcome: Progressing   Problem: Safety: Goal: Ability to remain free from injury will improve Outcome: Progressing   Problem: Skin Integrity: Goal: Risk for impaired skin integrity will decrease Outcome: Progressing   

## 2022-06-04 NOTE — Progress Notes (Signed)
Echo attempted at 1030, PT just starting to work with patient. Will reattempt as schedule permits [employer]

## 2022-06-04 NOTE — Progress Notes (Addendum)
Assisted pt to the bathroom. Pt then suddenly felt unwell, dizzy and passed out. Held pt on both arms and helped lowered to the floor. Pt looked pale, HR was 30's on the monitor at that time. Called for help, pt became responsive and was then helped to the chair then back to bed. VS taken BP were 118/64, HR 64 02sat 99% when he got back to bed. Rapid response was called. Dr On call was paged. EKG done.   Patient did not want to notify his wife or family members after incident. Dr ordered to get orthostatic VS when able  Orthostatic VS for the past 24 hrs (Last 3 readings):  BP- Lying Pulse- Lying BP- Sitting Pulse- Sitting BP- Standing at 0 minutes Pulse- Standing at 0 minutes BP- Standing at 3 minutes Pulse- Standing at 3 minutes  06/04/22 0501 123/79 88 113/76 88 109/64 77 90/72 98

## 2022-06-04 NOTE — Progress Notes (Addendum)
Pt had episode of 9 beats NSVT on tele. Pt was seen awake, alert VS stable. Denies CP, having palpitations, dizziness. Dr on call paged

## 2022-06-04 NOTE — Progress Notes (Signed)
Rounding Note    Patient Name: Gregory Gibbs Date of Encounter: 06/04/2022  [facility] Cardiologist: None   Subjective   BP 132/78, Cr stable at 1.26, hemoglobin 13.9.  Rapid response called overnight, had syncopal episode while in bathroom.  He walked to the bathroom and urinated and felt dizzy and passed out.  He was lowered to the floor, did not fall or hit his head.  Became bradycardic with heart rate 30s during episode.  Denies any chest pain or dyspnea  Inpatient Medications    Scheduled Meds:  aspirin EC  81 mg Oral Daily   atorvastatin  80 mg Oral Daily   enoxaparin (LOVENOX) injection  40 mg Subcutaneous Q24H   metoprolol succinate  25 mg Oral Daily   sodium chloride flush  3 mL Intravenous Q12H   ticagrelor  90 mg Oral BID   Continuous Infusions:  sodium chloride     PRN Meds: sodium chloride, acetaminophen, lip balm, nitroGLYCERIN, ondansetron (ZOFRAN) IV, sodium chloride flush   Vital Signs    Vitals:   06/03/22 2334 06/03/22 2351 06/04/22 0501 06/04/22 0734  BP: 118/64 114/68 123/79 132/78  Pulse:  60 88   Resp:  18  18  Temp:  (!) 97.5 F (36.4 C)  (!) 97.5 F (36.4 C)  TempSrc:  Oral  Oral  SpO2:  99% 99%   Weight:        Intake/Output Summary (Last 24 hours) at 06/04/2022 0939 Last data filed at 06/04/2022 0444 Gross per 24 hour  Intake 591.18 ml  Output --  Net 591.18 ml      06/03/2022    5:02 PM  Last 3 Weights  Weight (lbs) 156 lb  Weight (kg) 70.761 kg      Telemetry    NSR, had brief brady to 30s overnight - Personally Reviewed  ECG    NSR, PVC, nonspecific T wave flattening - Personally Reviewed  Physical Exam   GEN: No acute distress.   Neck: No JVD Cardiac: RRR, no murmurs Respiratory: Clear to auscultation bilaterally. GI: Soft, nontender, non-distended  MS: No edema; No deformity. Neuro:  Nonfocal  Psych: Normal affect   Labs    High Sensitivity Troponin:  No results for input(s): "TROPONINIHS" in the  last 720 hours.   Chemistry Recent Labs  Lab 06/03/22 1933 06/04/22 0159  NA  --  137  K  --  3.8  CL  --  105  CO2  --  24  GLUCOSE  --  83  BUN  --  13  CREATININE 1.25* 1.26*  CALCIUM  --  8.6*  GFRNONAA >60 59*  ANIONGAP  --  8    Lipids  Recent Labs  Lab 06/04/22 0159  CHOL 215*  TRIG 87  HDL 49  LDLCALC 149*  CHOLHDL 4.4    Hematology Recent Labs  Lab 06/03/22 1933 06/04/22 0159  WBC 8.0 10.4  RBC 4.74 4.65  HGB 13.8 13.9  HCT 40.5 40.6  MCV 85.4 87.3  MCH 29.1 29.9  MCHC 34.1 34.2  RDW 11.7 11.9  PLT 213 217   Thyroid No results for input(s): "TSH", "FREET4" in the last 168 hours.  BNPNo results for input(s): "BNP", "PROBNP" in the last 168 hours.  DDimer No results for input(s): "DDIMER" in the last 168 hours.   Radiology    CARDIAC CATHETERIZATION  Result Date: 06/03/2022   Prox LAD lesion is 90% stenosed.   1st Diag lesion is 45%  stenosed.   1st Mrg lesion is 75% stenosed.   A drug-eluting stent was successfully placed using a SYNERGY XD 3.0X12.   Post intervention, there is a 0% residual stenosis.   The left ventricular systolic function is normal.   LV end diastolic pressure is mildly elevated.   The left ventricular ejection fraction is 55-65% by visual estimate. 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 75% segmental stenosis in the first OM Normal LV function Mildly elevated LVEDP 16 mm Hg Successful PCI of the proximal LAD with DES x 1. Plan: DAPT for one year. Would treat OM disease medically. If he has refractory angina could consider staged PCI of the OM.    Cardiac Studies     Patient Profile     77 y.o. male with past medical history of hypothyroidism and GERD who presented with unstable angina  Assessment & Plan    Unstable angina: Presented to [facility] with exertional chest pain.  Troponins negative.  Exercise Myoview showed EKG changes concerning for ischemia and moderate-sized region of  reversible ischemia in anterior septal wall, EF 45%, high risk study.  Transferred to [facility] for cath, which showed 90% proximal LAD stenosis, 75% OM1, 45% D1.  Underwent successful PCI with DES to proximal LAD.  Recommended medical management of OM disease -Continue aspirin 81 mg daily, ticagrelor 90 mg twice daily -Continue atorvastatin 80 mg daily -Hold metoprolol given orthostatic hypotension and episode of bradycardia overnight  Syncope: Rapid response called 5/31, had syncopal episode while in bathroom.  He walked to the bathroom and urinated and felt dizzy and passed out.  He was lowered to the floor, did not fall or hit his head.  Became bradycardic with heart rate 30s during episode.  Appears likely vagal episode.  Orthostatics showed drop in BP from 120s to 90s going from lying to standing -Hold metoprolol -Continue to monitor on telemetry -Will repeat orthostatics tomorrow  CKD stage III: Creatinine 1.4, improved to 1.26 today  Hyperlipidemia: On atorvastatin 80 mg daily.  LDL 149    For questions or updates, please contact [facility] Please consult [email or web address] for contact info under        Signed, Little Ishikawa, MD  06/04/2022, 9:39 AM

## 2022-06-04 NOTE — Progress Notes (Signed)
Echocardiogram 2D Echocardiogram has been performed.  Warren Lacy  RDCS 06/04/2022, 12:33 PM

## 2022-06-04 NOTE — Progress Notes (Signed)
CARDIAC REHAB PHASE I   PRE:  Rate/Rhythm: 78 SR with PVC    BP: lying 123/73  sitting 120/72  standing 112/66,  standing 3 min 107/70    SpO2: 99 RA  MODE:  Ambulation: 470 ft   POST:  Rate/Rhythm: 87 SR    BP: sitting 115/75     SpO2:   Pt feeling well, no sx of orthostasis. Tolerated walk well. To recliner  Discussed with pt stent, restrictions, Brilinta importance, diet, exercise, NTG and CRPII. Pt receptive. Will refer to [place] CRPII. Pt can be up ambulating today but encouraged to get up slowly.  [id number]  Ethelda Chick BS, ACSM-CEP 06/04/2022 11:19 AM

## 2022-06-04 NOTE — Progress Notes (Signed)
   06/03/22 2351  What Happened  Was fall witnessed? Yes  Who witnessed fall? RN  Patients activity before fall bathroom-assisted  Point of contact other (comment)  Was patient injured? No  Provider Notification  Provider Name/Title Hector Brunswick  Date Provider Notified 06/03/22  Time Provider Notified 2350  Method of Notification Page  Notification Reason Fall  Provider response No new orders  Date of Provider Response 06/04/22  Time of Provider Response 0030  Follow Up  Family notified No - patient refusal  Additional tests No  Simple treatment Other (comment) (Fan, wet wash cloths)  Progress note created (see row info) Yes  Blank note created Yes  Adult Fall Risk Assessment  Risk Factor Category (scoring not indicated) High fall risk per protocol (document High fall risk)  Patient Fall Risk Level High fall risk  Adult Fall Risk Interventions  Required Bundle Interventions *See Row Information* High fall risk - low, moderate, and high requirements implemented  Additional Interventions Use of appropriate toileting equipment (bedpan, BSC, etc.)  Fall intervention(s) refused/Patient educated regarding refusal Bed alarm;Nonskid socks;Open door if unsupervised  Screening for Fall Injury Risk (To be completed on HIGH fall risk patients) - Assessing Need for Floor Mats  Risk For Fall Injury- Criteria for Floor Mats None identified - No additional interventions needed  Vitals  Temp (!) 97.5 F (36.4 C)  Temp Source Oral  BP 114/68  MAP (mmHg) 82  BP Location Left Arm  BP Method Automatic  Patient Position (if appropriate) Lying  Pulse Rate 60  Pulse Rate Source Monitor  ECG Heart Rate 60  Resp 18  Oxygen Therapy  SpO2 99 %  O2 Device Room Air  Pulse Oximetry Type Continuous  Pain Assessment  Pain Scale 0-10  Pain Score 0  Neurological  Neuro (WDL) WDL  Level of Consciousness Alert  Musculoskeletal  Musculoskeletal (WDL) WDL  Assistive Device None  Integumentary   Integumentary (WDL) WDL

## 2022-06-05 DIAGNOSIS — I255 Ischemic cardiomyopathy: Secondary | ICD-10-CM | POA: Diagnosis not present

## 2022-06-05 DIAGNOSIS — I200 Unstable angina: Secondary | ICD-10-CM | POA: Diagnosis not present

## 2022-06-05 DIAGNOSIS — I951 Orthostatic hypotension: Secondary | ICD-10-CM | POA: Diagnosis not present

## 2022-06-05 LAB — LIPOPROTEIN A (LPA): Lipoprotein (a): 34.7 nmol/L — ABNORMAL HIGH (ref <75.0)

## 2022-06-05 LAB — CBC
HCT: 38.9 % — ABNORMAL LOW (ref 39.0–52.0)
Hemoglobin: 13 g/dL (ref 13.0–17.0)
MCH: 28.6 pg (ref 26.0–34.0)
MCHC: 33.4 g/dL (ref 30.0–36.0)
MCV: 85.5 fL (ref 80.0–100.0)
Platelets: 221 10*3/uL (ref 150–400)
RBC: 4.55 MIL/uL (ref 4.22–5.81)
RDW: 11.9 % (ref 11.5–15.5)
WBC: 7.3 10*3/uL (ref 4.0–10.5)
nRBC: 0 % (ref 0.0–0.2)

## 2022-06-05 LAB — BASIC METABOLIC PANEL
Anion gap: 7 (ref 5–15)
BUN: 12 mg/dL (ref 8–23)
CO2: 27 mmol/L (ref 22–32)
Calcium: 9 mg/dL (ref 8.9–10.3)
Chloride: 103 mmol/L (ref 98–111)
Creatinine, Ser: 1.33 mg/dL — ABNORMAL HIGH (ref 0.61–1.24)
GFR, Estimated: 56 mL/min — ABNORMAL LOW (ref >60)
Glucose, Bld: 101 mg/dL — ABNORMAL HIGH (ref 70–99)
Potassium: 4 mmol/L (ref 3.5–5.1)
Sodium: 137 mmol/L (ref 135–145)

## 2022-06-05 LAB — MAGNESIUM: Magnesium: 2 mg/dL (ref 1.7–2.4)

## 2022-06-05 MED ORDER — TICAGRELOR 90 MG PO TABS: 90.0000 mg | ORAL_TABLET | Freq: Two times a day (BID) | ORAL | 1 refills | Status: DC

## 2022-06-05 MED ORDER — SODIUM CHLORIDE 0.9 % IV SOLN: INTRAVENOUS | Status: AC

## 2022-06-05 MED ORDER — ATORVASTATIN CALCIUM 80 MG PO TABS: 80.0000 mg | ORAL_TABLET | Freq: Every day | ORAL | 0 refills | Status: DC

## 2022-06-05 MED ORDER — NITROGLYCERIN 0.4 MG SL SUBL: 0.4000 mg | SUBLINGUAL_TABLET | SUBLINGUAL | 2 refills | Status: AC | PRN

## 2022-06-05 MED ORDER — ATORVASTATIN CALCIUM 80 MG PO TABS: 80.0000 mg | ORAL_TABLET | Freq: Every day | ORAL | 1 refills | Status: DC

## 2022-06-05 MED ORDER — ASPIRIN 81 MG PO TBEC: 81.0000 mg | DELAYED_RELEASE_TABLET | Freq: Every day | ORAL | 12 refills | Status: AC

## 2022-06-05 MED ORDER — LEVOTHYROXINE SODIUM 50 MCG PO TABS
50.0000 ug | ORAL_TABLET | Freq: Every morning | ORAL | Status: DC
  Administered 2022-06-05: 50 ug via ORAL
  Filled 2022-06-05: qty 1

## 2022-06-05 MED ORDER — TICAGRELOR 90 MG PO TABS: 90.0000 mg | ORAL_TABLET | Freq: Two times a day (BID) | ORAL | 0 refills | Status: DC

## 2022-06-05 NOTE — Discharge Summary (Signed)
Discharge Summary    Patient ID: Gregory Gibbs MRN: [id number]; DOB: [birth year]  Admit date: 06/03/2022 Discharge date: 06/05/2022  PCP:  Keturah Barre, MD   [facility] Providers Cardiologist:  Evaluated by Dr. Swaziland this admission but will follow-up in   Discharge Diagnoses    Principal Problem:   Unstable angina [facility]) Active Problems:   Syncope and collapse   Orthostatic hypotension   Ischemic cardiomyopathy   Diagnostic Studies/Procedures    Cardiac Catheterization: 06/03/2022   Prox LAD lesion is 90% stenosed.   1st Diag lesion is 45% stenosed.   1st Mrg lesion is 75% stenosed.   A drug-eluting stent was successfully placed using a SYNERGY XD 3.0X12.   Post intervention, there is a 0% residual stenosis.   The left ventricular systolic function is normal.   LV end diastolic pressure is mildly elevated.   The left ventricular ejection fraction is 55-65% by visual estimate.   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 75% segmental stenosis in the first OM Normal LV function Mildly elevated LVEDP 16 mm Hg Successful PCI of the proximal LAD with DES x 1.   Plan: DAPT for one year. Would treat OM disease medically. If he has refractory angina could consider staged PCI of the OM.  Echocardiogram: 06/04/2022 IMPRESSIONS     1. Left ventricular ejection fraction, by estimation, is 45 to 50%. The  left ventricle has mildly decreased function. The left ventricle  demonstrates global hypokinesis. Left ventricular diastolic parameters are  consistent with Grade I diastolic  dysfunction (impaired relaxation).   2. Right ventricular systolic function is normal. The right ventricular  size is normal. Tricuspid regurgitation signal is inadequate for assessing  PA pressure.   3. The mitral valve is normal in structure. Mild mitral valve  regurgitation.   4. The aortic valve is tricuspid. Aortic valve  regurgitation is not  visualized.   5. The inferior vena cava is normal in size with greater than 50%  respiratory variability, suggesting right atrial pressure of 3 mmHg.     History of Present Illness     Gregory Gibbs is a 77 y.o. male with past medical history of hypothyroidism and GERD who presented to [facility] on 06/03/2022 as a transfer from [facility] for an abnormal stress test.  He had initially presented to the hospital for evaluation of epigastric pain which had been occurring for the past 8 days with exertion and improving with rest. Troponin values were negative with Troponin I at 0.01, 0.02 and 0.02 and proBNP was mildly elevated at 16. Given his concerning symptoms for angina, a Treadmill Myoview was recommended for further evaluation and showed ST depression along the inferior and lateral leads and imaging showed a moderate-sized region of reversible ischemia in the anterior septal wall. Given his symptoms and abnormal stress test, he was transferred to [facility] with plans for a cardiac catheterization for definitive evaluation.  Hospital Course     Consultants: None   He underwent catheterization on 06/03/2022 which showed 90% proximal LAD stenosis with 45% first diagonal stenosis and 75% first marginal stenosis. He underwent DES placement to the proximal LAD and medical management of his OM disease was recommended. If he had refractory angina, could consider staged PCI of the OM.  LVEDP was mildly elevated at 16 mmHg. Follow-up echocardiogram did show his EF was mildly reduced at 45 to 50% with global hypokinesis and grade 1  diastolic dysfunction. RV function was normal and he only had mild MR. He is placed on medical therapy with ASA 81mg , Brilinta 90mg  BID, lipitor 80mg , and PRN  Nitro. New scripts sent to pharmacy today.   During the evening following his catheterization, he reported feeling dizzy and had a syncopal episode. He was found to have a heart rate in the  30's at that time and also be orthostatic. This was felt to likely be of a vasovagal etiology. He had been on Metoprolol and this was discontinued.  On 06/05/2022, he reported still having occasional "funny feelings" in his chest but denied any chest pain. Telemetry did show frequent PVC's but he was not restarted on a beta-blocker given prior bradycardia and orthostatic hypotension. Orthostatics were obtained and showed his SBP dropped from 132 with lying down to 87 and with standing. He received IV fluids and follow-up VS showed supine BP 144/98 and AP 65, standing BP 134/86 and AP 70 at 3:40pm today. He is not feeling dizzy and offers no complaints. He is stable for discharge to home this afternoon.  He does have follow-up scheduled at our [facility] office on 06/16/22.    Did the patient have an acute coronary syndrome (MI, NSTEMI, STEMI, etc) this admission?:  No                               Did the patient have a percutaneous coronary intervention (stent / angioplasty)?:  Yes.     Cath/PCI Registry Performance & Quality Measures: Aspirin prescribed? - Yes ADP Receptor Inhibitor (Plavix/Clopidogrel, Brilinta/Ticagrelor or Effient/Prasugrel) prescribed (includes medically managed patients)? - Yes High Intensity Statin (Lipitor 40-80mg  or Crestor 20-40mg ) prescribed? - Yes For EF <40%, was ACEI/ARB prescribed? - Not Applicable (EF >/= 40%) For EF <40%, Aldosterone Antagonist (Spironolactone or Eplerenone) prescribed? - Not Applicable (EF >/= 40%) Cardiac Rehab Phase II ordered? - Yes  _____________  Discharge Vitals Blood pressure 132/76, pulse 74, temperature 98.3 F (36.8 C), temperature source Oral, resp. rate 16, weight 70.8 kg, SpO2 97 %.  Filed Weights   06/03/22 1702  Weight: 70.8 kg   See attending rounding note today for physical exam   Labs & Radiologic Studies    CBC Recent Labs    06/04/22 0159 06/05/22 0229  WBC 10.4 7.3  HGB 13.9 13.0  HCT 40.6 38.9*  MCV 87.3 85.5   PLT 217 221   Basic Metabolic Panel Recent Labs    16/10/96 0159 06/05/22 0227 06/05/22 0229  NA 137  --  137  K 3.8  --  4.0  CL 105  --  103  CO2 24  --  27  GLUCOSE 83  --  101*  BUN 13  --  12  CREATININE 1.26*  --  1.33*  CALCIUM 8.6*  --  9.0  MG  --  2.0  --    Liver Function Tests No results for input(s): "AST", "ALT", "ALKPHOS", "BILITOT", "PROT", "ALBUMIN" in the last 72 hours. No results for input(s): "LIPASE", "AMYLASE" in the last 72 hours. High Sensitivity Troponin:   No results for input(s): "TROPONINIHS" in the last 720 hours.  BNP Invalid input(s): "POCBNP" D-Dimer No results for input(s): "DDIMER" in the last 72 hours. Hemoglobin A1C No results for input(s): "HGBA1C" in the last 72 hours. Fasting Lipid Panel Recent Labs    06/04/22 0159  CHOL 215*  HDL 49  LDLCALC 149*  TRIG 87  CHOLHDL 4.4   Thyroid Function Tests No results for input(s): "TSH", "T4TOTAL", "T3FREE", "THYROIDAB" in the last 72 hours.  Invalid input(s): "FREET3" _____________   Disposition   Patient was seen by Dr Bjorn Pippin today, deemed stable for discharge. Post cath care, medication changes, as well as follow up had been discussed and arranged. His family has already picked up his new medications from pharmacy today. Pt is being discharged home today in good condition.  Follow-up Plans & Appointments     Follow-up Information     Flossie Dibble, NP Follow up on 06/16/2022.   Specialty: Cardiology Why: Cardiology Hospital Follow-up on 06/16/2022 at 2:00 PM. Contact information: [address] [place] [address] [phone]                Discharge Instructions     Amb Referral to Cardiac Rehabilitation   Complete by: As directed    Diagnosis:  Coronary Stents PTCA     After initial evaluation and assessments completed: Virtual Based Care may be provided alone or in conjunction with Phase 2 Cardiac Rehab based on patient barriers.:  Yes   Intensive Cardiac Rehabilitation (ICR) [facility] location only OR Traditional Cardiac Rehabilitation (TCR) *If criteria for ICR are not met will enroll in TCR [facility] only): Yes   Diet - low sodium heart healthy   Complete by: As directed    Discharge instructions   Complete by: As directed    PLEASE REMEMBER TO BRING ALL OF YOUR MEDICATIONS TO EACH OF YOUR FOLLOW-UP OFFICE VISITS.  PLEASE ATTEND ALL SCHEDULED FOLLOW-UP APPOINTMENTS.   Activity: Increase activity slowly as tolerated. You may shower, but no soaking baths (or swimming) for 1 week. No driving for 24 hours. No lifting over 5 lbs for 1 week. No sexual activity for 1 week.   You May Return to Work: in 1 week (if applicable)  Wound Care: You may wash cath site gently with soap and water. Keep cath site clean and dry. If you notice pain, swelling, bleeding or pus at your cath site, please call [phone].   Increase activity slowly   Complete by: As directed         Discharge Medications   Allergies as of 06/05/2022   No Known Allergies      Medication List     TAKE these medications    aspirin EC 81 MG tablet Take 1 tablet (81 mg total) by mouth daily. Swallow whole. Start taking on: June 06, 2022   atorvastatin 80 MG tablet Commonly known as: LIPITOR Take 1 tablet (80 mg total) by mouth daily. Start taking on: June 06, 2022   Cyanocobalamin 1000 MCG Tbcr Take 1 tablet by mouth daily.   levothyroxine 50 MCG tablet Commonly known as: SYNTHROID Take 50 mcg by mouth every morning.   loratadine 10 MG tablet Commonly known as: CLARITIN Take 10 mg by mouth daily.   multivitamin with minerals Tabs tablet Take 1 tablet by mouth daily.   nitroGLYCERIN 0.4 MG SL tablet Commonly known as: NITROSTAT Place 1 tablet (0.4 mg total) under the tongue every 5 (five) minutes x 3 doses as needed for chest pain. Notes to patient: The proper use and anticipated side effects of nitroglycerine has been carefully explained.   If a single episode of chest pain is not relieved by one tablet, the patient will try another within 5 minutes; and if this doesn't relieve the pain, the patient is instructed to call 911 for  transportation to an emergency department.    ticagrelor 90 MG Tabs tablet Commonly known as: BRILINTA Take 1 tablet (90 mg total) by mouth 2 (two) times daily.           Outstanding Labs/Studies   FLP and LFT's in 6-8 weeks.   Duration of Discharge Encounter   Greater than 30 minutes including physician time.  Signed, Cyndi Bender, NP 06/05/2022, 3:47 PM

## 2022-06-05 NOTE — Progress Notes (Addendum)
Rounding Note    Patient Name: Gregory Gibbs Date of Encounter: 06/05/2022  [facility] Cardiologist: Peter Swaziland, MD   Subjective   Feels well this morning. Reports a "funny feeling" in his chest which occurs at rest and we reviewed this could be his PVC's. No chest pain. Ambulated down the hallway yesterday without symptoms. Reports he was also asymptomatic when BP was checked this morning (SBP went from 132 with lying to 87 with standing while checking orthostatics). He is anxious to go home today.   Inpatient Medications    Scheduled Meds:  aspirin EC  81 mg Oral Daily   atorvastatin  80 mg Oral Daily   sodium chloride flush  3 mL Intravenous Q12H   ticagrelor  90 mg Oral BID   Continuous Infusions:  sodium chloride     sodium chloride     PRN Meds: sodium chloride, acetaminophen, lip balm, nitroGLYCERIN, ondansetron (ZOFRAN) IV, sodium chloride flush   Vital Signs    Vitals:   06/04/22 1221 06/04/22 1613 06/04/22 2050 06/05/22 0525  BP: 110/69 118/69 125/85 132/76  Pulse: 69 69  74  Resp: 18 18 18 16   Temp: 98.2 F (36.8 C) (!) 97.5 F (36.4 C) 98.7 F (37.1 C) 98.3 F (36.8 C)  TempSrc: Oral Oral Oral Oral  SpO2: 99%  98% 97%  Weight:       No intake or output data in the 24 hours ending 06/05/22 0838    06/03/2022    5:02 PM  Last 3 Weights  Weight (lbs) 156 lb  Weight (kg) 70.761 kg      Telemetry    NSR, HR in 60's to 70's with frequent PVC's. Occasional couplets and one episode of 3 beats NSVT.  - Personally Reviewed  ECG    NSR, HR 67 with PVC's and TWI along V4-V6.   - Personally Reviewed  Physical Exam   GEN: Pleasant male appearing in no acute distress.   Neck: No JVD Cardiac: RRR with frequent ectopic beats, no murmurs, rubs, or gallops. Radial cath site stable without ecchymosis or evidence of a hematoma.  Respiratory: Clear to auscultation bilaterally without wheezing or rales. GI: Soft, nontender, non-distended  MS:  No pitting edema; No deformity. Neuro:  Nonfocal  Psych: Normal affect   Labs    High Sensitivity Troponin:  No results for input(s): "TROPONINIHS" in the last 720 hours.   Chemistry Recent Labs  Lab 06/03/22 1933 06/04/22 0159 06/05/22 0229  NA  --  137 137  K  --  3.8 4.0  CL  --  105 103  CO2  --  24 27  GLUCOSE  --  83 101*  BUN  --  13 12  CREATININE 1.25* 1.26* 1.33*  CALCIUM  --  8.6* 9.0  GFRNONAA >60 59* 56*  ANIONGAP  --  8 7    Lipids  Recent Labs  Lab 06/04/22 0159  CHOL 215*  TRIG 87  HDL 49  LDLCALC 149*  CHOLHDL 4.4    Hematology Recent Labs  Lab 06/03/22 1933 06/04/22 0159 06/05/22 0229  WBC 8.0 10.4 7.3  RBC 4.74 4.65 4.55  HGB 13.8 13.9 13.0  HCT 40.5 40.6 38.9*  MCV 85.4 87.3 85.5  MCH 29.1 29.9 28.6  MCHC 34.1 34.2 33.4  RDW 11.7 11.9 11.9  PLT 213 217 221   Thyroid No results for input(s): "TSH", "FREET4" in the last 168 hours.  BNPNo results for input(s): "BNP", "PROBNP"  in the last 168 hours.  DDimer No results for input(s): "DDIMER" in the last 168 hours.   Cardiac Studies   LHC: 06/03/2022   Prox LAD lesion is 90% stenosed.   1st Diag lesion is 45% stenosed.   1st Mrg lesion is 75% stenosed.   A drug-eluting stent was successfully placed using a SYNERGY XD 3.0X12.   Post intervention, there is a 0% residual stenosis.   The left ventricular systolic function is normal.   LV end diastolic pressure is mildly elevated.   The left ventricular ejection fraction is 55-65% by visual estimate.   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 75% segmental stenosis in the first OM Normal LV function Mildly elevated LVEDP 16 mm Hg Successful PCI of the proximal LAD with DES x 1.   Plan: DAPT for one year. Would treat OM disease medically. If he has refractory angina could consider staged PCI of the OM.   Echocardiogram: 06/04/2022 IMPRESSIONS     1. Left ventricular ejection fraction, by  estimation, is 45 to 50%. The  left ventricle has mildly decreased function. The left ventricle  demonstrates global hypokinesis. Left ventricular diastolic parameters are  consistent with Grade I diastolic  dysfunction (impaired relaxation).   2. Right ventricular systolic function is normal. The right ventricular  size is normal. Tricuspid regurgitation signal is inadequate for assessing  PA pressure.   3. The mitral valve is normal in structure. Mild mitral valve  regurgitation.   4. The aortic valve is tricuspid. Aortic valve regurgitation is not  visualized.   5. The inferior vena cava is normal in size with greater than 50%  respiratory variability, suggesting right atrial pressure of 3 mmHg.   Patient Profile     77 y.o. male w/ PMH of hypothyroidism and GERD who was transferred from [place] due to abnormal NST and received DES to proximal-LAD.   Assessment & Plan    1. Unstable Angina/CAD/Ischemic Cardiomyopathy - He had an abnormal Exercise Myoview at [facility] and underwent cath on 06/03/2022 and received DES to proximal-LAD with medical management recommended of OM disease. EF at 45-50% this admission with global HK.  - Continue ASA 81mg  daily, Brilinta 90mg  BID and Atorvastatin 80mg  daily. No longer on a beta-blocker given orthostatic hypotension. Unable to start GDMT at this time given his orthostatic hypotension.   2. Syncope/Orthostatic Hypotension - Previously had a syncopal episode on 5/31 while in the restroom with HR temporarily in the 30's and orthostatics positive. Felt to be vasovagal. No longer on Lopressor.  - Orthostatics checked this morning and still positive with BP at 132/76 with lying, 119/73 with sitting, 104/61 with standing and 87/66 after standing for 3 minutes. Not currently on antihypertensives. Will give IV fluids this morning and reassess later today. Would typically not use Midodrine given his baseline BP is well-controlled.   3. HLD - FLP  this admission showed total cholesterol of 215, triglycerides 87, HDL 49 and LDL 149. He has been started on Atorvastatin 80mg  daily. Will need repeat FLP and LFT's in 6-8 weeks.   4. Stage 3 CKD - Unknown baseline, creatinine at 1.2 - 1.3 this admission.   5. PVC's - He does have frequent PVC's and one episode of 3 beats NSVT on telemetry. No longer on BB therapy as discussed above. K+ was at 4.0 this AM. Will add-on Mg to previously drawn labs.   6. Hypothyroidism - On Synthroid PTA but  not ordered this admission. Will order his home dose of 50 mcg daily.   For questions or updates, please contact [facility] Please consult [email or web address] for contact info under        Signed, [name], PA-C  06/05/2022, 8:38 AM     Patient seen and examined.  Agree with above documentation.  Orthostatics remain positive.  Will give IV fluids this morning and reassess orthostatics this afternoon.  Mildly reduced EF, unable to add GDMT at this time due to orthostasis.  Little Ishikawa, MD

## 2022-06-05 NOTE — Plan of Care (Signed)

## 2022-06-06 ENCOUNTER — Other Ambulatory Visit (HOSPITAL_COMMUNITY): Payer: Self-pay | Admitting: *Deleted

## 2022-06-06 ENCOUNTER — Encounter (HOSPITAL_COMMUNITY): Payer: Self-pay | Admitting: Cardiology

## 2022-06-06 DIAGNOSIS — I2102 ST elevation (STEMI) myocardial infarction involving left anterior descending coronary artery: Secondary | ICD-10-CM

## 2022-06-06 DIAGNOSIS — Z955 Presence of coronary angioplasty implant and graft: Secondary | ICD-10-CM

## 2022-06-07 ENCOUNTER — Telehealth (HOSPITAL_COMMUNITY): Payer: Self-pay

## 2022-06-07 DIAGNOSIS — M1990 Unspecified osteoarthritis, unspecified site: Secondary | ICD-10-CM

## 2022-06-07 DIAGNOSIS — M2550 Pain in unspecified joint: Secondary | ICD-10-CM | POA: Insufficient documentation

## 2022-06-07 NOTE — Telephone Encounter (Signed)
Per Phase 1 cardiac rehab fax referral to [facility].

## 2022-06-14 DIAGNOSIS — H5203 Hypermetropia, bilateral: Secondary | ICD-10-CM | POA: Diagnosis not present

## 2022-06-14 DIAGNOSIS — Z0100 Encounter for examination of eyes and vision without abnormal findings: Secondary | ICD-10-CM | POA: Diagnosis not present

## 2022-06-16 ENCOUNTER — Encounter: Payer: Self-pay | Admitting: Cardiology

## 2022-06-16 ENCOUNTER — Ambulatory Visit: Payer: Medicare HMO | Attending: Cardiology | Admitting: Cardiology

## 2022-06-16 VITALS — BP 120/60 | HR 67 | Ht 69.0 in | Wt 158.2 lb

## 2022-06-16 DIAGNOSIS — E782 Mixed hyperlipidemia: Secondary | ICD-10-CM

## 2022-06-16 DIAGNOSIS — I5022 Chronic systolic (congestive) heart failure: Secondary | ICD-10-CM | POA: Diagnosis not present

## 2022-06-16 DIAGNOSIS — E785 Hyperlipidemia, unspecified: Secondary | ICD-10-CM | POA: Diagnosis not present

## 2022-06-16 DIAGNOSIS — I5020 Unspecified systolic (congestive) heart failure: Secondary | ICD-10-CM

## 2022-06-16 DIAGNOSIS — I25119 Atherosclerotic heart disease of native coronary artery with unspecified angina pectoris: Secondary | ICD-10-CM | POA: Diagnosis not present

## 2022-06-16 DIAGNOSIS — N1831 Chronic kidney disease, stage 3a: Secondary | ICD-10-CM

## 2022-06-16 NOTE — Patient Instructions (Addendum)
Medication Instructions:  Your physician recommends that you continue on your current medications as directed. Please refer to the Current Medication list given to you today.  *If you need a refill on your cardiac medications before your next appointment, please call your pharmacy*   Lab Work: Your physician recommends that you return for lab work in: Today for BMP Come back in 6 weeks for Fasting Lipid Panel and Liver Function Lab opens at 8am. You DO NOT NEED an appointment. Best time to come is between 8am and 12noon and between 1:30 and 4:30. If you have been asked to fast for your blood work please have nothing to eat or drink after midnight. You may have water.   If you have labs (blood work) drawn today and your tests are completely normal, you will receive your results only by: MyChart Message (if you have MyChart) OR A paper copy in the mail If you have any lab test that is abnormal or we need to change your treatment, we will call you to review the results.   Testing/Procedures: NONE   Follow-Up: At [facility], you and your health needs are our priority.  As part of our continuing mission to provide you with exceptional heart care, we have created designated Provider Care Teams.  These Care Teams include your primary Cardiologist (physician) and Advanced Practice Providers (APPs -  Physician Assistants and Nurse Practitioners) who all work together to provide you with the care you need, when you need it.  We recommend signing up for the patient portal called "MyChart".  Sign up information is provided on this After Visit Summary.  MyChart is used to connect with patients for Virtual Visits (Telemedicine).  Patients are able to view lab/test results, encounter notes, upcoming appointments, etc.  Non-urgent messages can be sent to your provider as well.   To learn more about what you can do with MyChart, go to [email or web address].    Your next appointment:  08/22/22  at 3:40    Provider:   Gypsy Balsam, MD    Other Instructions

## 2022-06-16 NOTE — Progress Notes (Signed)
Cardiology Office Note:    Date:  06/16/2022   ID:  Gregory Gibbs, DOB 02-03-1945, MRN [id number]  PCP:  Keturah Barre, MD   [facility] Providers Cardiologist:  Peter Swaziland, MD     Referring MD: Keturah Barre, MD   CC: follow up cad  History of Present Illness:    Gregory Gibbs is a 77 y.o. male with a hx of CAD s/p DES to proximal LAD on 06/03/2022, hypothyroidism, GERD.  He presented to [facility] for evaluation of epigastric pain which been occurring for the past 8 days and 1 improving with rest.  Troponin I at 0.01, 0.02 and 0.02.  He underwent a treadmill Myoview which showed ST depression along the inferior and lateral leads, moderate size region of reversible ischemia in the anterior septal wall.  He was transferred to [facility] and underwent left heart cath on 06/03/22 which revealed two-vessel CAD with culprit lesion in the proximal large LAD, DES x 1; 75% segmental stenosis in the first OM with recommendations to treat medically.  Could consider staged PCI of the OM at a later time.  Continue DAPT x 1 year. LDL was elevated at 140.  Echo completed on 06/04/2022 revealed an EF of 45 to 50%, global hypokinesis, grade 1 DD, mild MR.  He presents today accompanied by his wife and daughter for follow-up after his recent LHC as outlined above.  He states he does not feel great, he previously only took medications for his thyroid and is having a hard time adjusting to his medications.  He does endorse some breathlessness however typically subsides if he takes a few deep breaths, advised him that is likely related to his Brilinta and should subside with time.  He has no further episodes of epigastric pain, which was his main symptom.  He is very eager to get back to his activities of daily living.  He typically is very active throughout the day and rarely sits still. He denies chest pain, palpitations, dyspnea, pnd, orthopnea, n, v, dizziness, syncope, edema, weight gain,  or early satiety.    Past Medical History:  Diagnosis Date   Acquired hypothyroidism 03/25/2014   Acute sinusitis 03/25/2014   Chronic foot pain 03/25/2014   Chronic neck pain 11/04/2014   Cough 03/25/2014   Elevated PSA 02/04/2020   Gastro-esophageal reflux disease without esophagitis 03/25/2014   GERD (gastroesophageal reflux disease)    History of Helicobacter pylori infection 03/25/2014   Hypothyroidism    Infection due to 2019 novel coronavirus 08/06/2018   Formatting of this note might be different from the original. 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   Rhinosinusitis 11/10/2016   Seasonal allergies 12/07/2018   Seborrheic keratoses 12/07/2018   Syncope and collapse 06/04/2022   Unstable angina (HCC) 06/03/2022   Vitamin D deficiency 03/25/2014    Past Surgical History:  Procedure Laterality Date   CORONARY STENT INTERVENTION N/A 06/03/2022   Procedure: CORONARY STENT INTERVENTION;  Surgeon: Swaziland, Peter M,  MD;  Location: [facility] INVASIVE CV LAB;  Service: Cardiovascular;  Laterality: N/A;   LEFT HEART CATH AND CORONARY ANGIOGRAPHY N/A 06/03/2022   Procedure: LEFT HEART CATH AND CORONARY ANGIOGRAPHY;  Surgeon: Swaziland, Peter M, MD;  Location: [facility] INVASIVE CV LAB;  Service: Cardiovascular;  Laterality: N/A;   left inguinal hernia repair Left    2024    Current Medications: Current  Meds  Medication Sig   aspirin EC 81 MG tablet Take 1 tablet (81 mg total) by mouth daily. Swallow whole.   atorvastatin (LIPITOR) 80 MG tablet Take 1 tablet (80 mg total) by mouth daily.   Cyanocobalamin 1000 MCG TBCR Take 1 tablet by mouth daily.   levothyroxine (SYNTHROID) 50 MCG tablet Take 50 mcg by mouth every morning.   Multiple Vitamin (MULTIVITAMIN WITH MINERALS) TABS tablet Take 1 tablet by mouth daily.   nitroGLYCERIN (NITROSTAT) 0.4 MG SL tablet Place 1 tablet (0.4 mg total) under the tongue every 5 (five) minutes x 3 doses as needed for chest pain.   ticagrelor (BRILINTA) 90 MG TABS tablet Take 1 tablet (90 mg total) by mouth 2 (two) times daily.     Allergies:   Patient has no known allergies.   Social History   Socioeconomic History   Marital status: Married    Spouse name: Not on file   Number of children: Not on file   Years of education: Not on file   Highest education level: Not on file  Occupational History   Not on file  Tobacco Use   Smoking status: Former    Types: Cigarettes   Smokeless tobacco: Never   Tobacco comments:    Quit in 1977, smoked a total of 12 years  Substance and Sexual Activity   Alcohol use: Never   Drug use: Never   Sexual activity: Not on file  Other Topics Concern   Not on file  Social History Narrative   Not on file   Social Determinants of Health   Financial Resource Strain: Not on file  Food Insecurity: Not on file  Transportation Needs: Not on file  Physical Activity: Not on file  Stress: Not on file  Social Connections: Not on file     Family History: The patient's family history includes Atrial fibrillation in his sister; Dementia in his father; Heart failure in his mother.  ROS:   Please see the history of present illness.     All other systems reviewed and are negative.  EKGs/Labs/Other Studies Reviewed:    The following studies were reviewed today: Cardiac Studies & Procedures   CARDIAC  CATHETERIZATION  CARDIAC CATHETERIZATION 06/03/2022  Narrative   Prox LAD lesion is 90% stenosed.   1st Diag lesion is 45% stenosed.   1st Mrg lesion is 75% stenosed.   A drug-eluting stent was successfully placed using a SYNERGY XD 3.0X12.   Post intervention, there is a 0% residual stenosis.   The left ventricular systolic function is normal.   LV end diastolic pressure is mildly elevated.   The left ventricular ejection fraction is 55-65% by visual estimate.  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 75% segmental stenosis in the first OM Normal LV function Mildly elevated LVEDP 16 mm Hg Successful PCI of the proximal LAD with DES x 1.  Plan: DAPT for one year. Would treat OM disease medically. If he has refractory angina could consider staged PCI of the OM.  Findings Coronary Findings Diagnostic  Dominance: Left  Left Anterior Descending Prox LAD lesion is 90% stenosed.  First Diagonal Branch 1st Diag lesion is 45% stenosed.  Left Circumflex  First Obtuse Marginal Branch 1st Mrg lesion is 75% stenosed. The lesion is segmental.  Right Coronary Artery Vessel is small. Vessel is angiographically normal.  Intervention  Prox LAD lesion Stent CATH VISTA GUIDE 6FR XBLAD3.5 guide catheter was inserted. Lesion crossed with guidewire using a WIRE ASAHI PROWATER 180CM. Pre-stent angioplasty was performed using a BALLN EMERGE MR 2.5X12. A drug-eluting stent was successfully placed using a SYNERGY XD 3.0X12. Stent strut is well apposed. Post-stent angioplasty was performed using a BALLN [place] EMERGE MR 3.0X8. Maximum pressure:  18 atm. Post-Intervention Lesion Assessment The intervention was successful. Pre-interventional TIMI flow is 3. Post-intervention TIMI flow is 3. No complications occurred at this lesion. There is a 0% residual stenosis post intervention.     ECHOCARDIOGRAM  ECHOCARDIOGRAM COMPLETE  06/04/2022  Narrative ECHOCARDIOGRAM REPORT    Patient Name:   INDIGO EMHOFF Date of Exam: 06/04/2022 Medical Rec #:  [id number]       Height:       68.0 in Accession #:    [id number]      Weight:       156.0 lb Date of Birth:  11/09/1945       BSA:          1.839 m Patient Age:    77 years        BP:           115/75 mmHg Patient Gender: M               HR:           79 bpm. Exam Location:  Inpatient  Procedure: 2D Echo, Color Doppler and Cardiac Doppler  Indications:    R07.9* Chest pain, unspecified  History:        Patient has no prior history of Echocardiogram examinations.  Sonographer:    Irving Burton Senior RDCS Referring Phys: [phone] HAO MENG  IMPRESSIONS   1. Left ventricular ejection fraction, by estimation, is 45 to 50%. The left ventricle has mildly decreased function. The left ventricle demonstrates global hypokinesis. Left ventricular diastolic parameters are consistent with Grade I diastolic dysfunction (impaired relaxation). 2. Right ventricular systolic function is normal. The right ventricular size is normal. Tricuspid regurgitation signal is inadequate for assessing PA pressure. 3. The mitral valve is normal in structure. Mild mitral valve regurgitation. 4. The aortic valve is tricuspid. Aortic valve regurgitation is not visualized. 5. The inferior vena cava is normal in size with greater than 50% respiratory variability, suggesting right atrial pressure of 3 mmHg.  FINDINGS Left Ventricle: Left ventricular ejection fraction, by estimation, is 45 to 50%. The left ventricle has mildly decreased function. The left ventricle demonstrates global hypokinesis. The left ventricular internal cavity size was normal in size. There is no left ventricular hypertrophy. Left ventricular diastolic parameters are consistent with Grade I diastolic dysfunction (impaired relaxation).  Right Ventricle: The right ventricular size is normal. No increase in right ventricular wall thickness.  Right ventricular systolic function is normal. Tricuspid regurgitation signal is inadequate for assessing PA pressure.  Left Atrium: Left atrial size was normal in size.  Right Atrium: Right atrial size was normal in size.  Pericardium: Trivial pericardial effusion is present.  Mitral Valve: The mitral valve is normal in structure. Mild mitral annular calcification. Mild mitral valve regurgitation.  Tricuspid Valve: The tricuspid valve is normal  in structure. Tricuspid valve regurgitation is trivial.  Aortic Valve: The aortic valve is tricuspid. Aortic valve regurgitation is not visualized.  Pulmonic Valve: The pulmonic valve was grossly normal. Pulmonic valve regurgitation is not visualized.  Aorta: The aortic root and ascending aorta are structurally normal, with no evidence of dilitation.  Venous: The inferior vena cava is normal in size with greater than 50% respiratory variability, suggesting right atrial pressure of 3 mmHg.  IAS/Shunts: The interatrial septum was not well visualized.   LEFT VENTRICLE PLAX 2D LVIDd:         5.00 cm      Diastology LVIDs:         3.70 cm      LV e' medial:    8.92 cm/s LV PW:         0.70 cm      LV E/e' medial:  9.1 LV IVS:        0.70 cm      LV e' lateral:   8.27 cm/s LVOT diam:     2.00 cm      LV E/e' lateral: 9.8 LV SV:         60 LV SV Index:   33 LVOT Area:     3.14 cm  LV Volumes (MOD) LV vol d, MOD A2C: 95.2 ml LV vol d, MOD A4C: 122.0 ml LV vol s, MOD A2C: 50.0 ml LV vol s, MOD A4C: 60.4 ml LV SV MOD A2C:     45.2 ml LV SV MOD A4C:     122.0 ml LV SV MOD BP:      52.0 ml  RIGHT VENTRICLE RV S prime:     11.50 cm/s TAPSE (M-mode): 2.0 cm  LEFT ATRIUM             Index        RIGHT ATRIUM           Index LA diam:        3.50 cm 1.90 cm/m   RA Area:     14.60 cm LA Vol (A2C):   49.3 ml 26.81 ml/m  RA Volume:   33.50 ml  18.22 ml/m LA Vol (A4C):   38.6 ml 20.99 ml/m LA Biplane Vol: 44.7 ml 24.31 ml/m AORTIC  VALVE LVOT Vmax:   87.80 cm/s LVOT Vmean:  65.500 cm/s LVOT VTI:    0.191 m  AORTA Ao Root diam: 2.90 cm Ao Asc diam:  3.20 cm  MITRAL VALVE MV Area (PHT): 3.48 cm    SHUNTS MV Decel Time: 218 msec    Systemic VTI:  0.19 m MV E velocity: 81.20 cm/s  Systemic Diam: 2.00 cm MV A velocity: 87.00 cm/s MV E/A ratio:  0.93  Donato Schultz MD Electronically signed by Donato Schultz MD Signature Date/Time: 06/04/2022/1:10:25 PM    Final              EKG:  EKG is not ordered today.    Recent Labs: 06/05/2022: BUN 12; Creatinine, Ser 1.33; Hemoglobin 13.0; Magnesium 2.0; Platelets 221; Potassium 4.0; Sodium 137  Recent Lipid Panel    Component Value Date/Time   CHOL 215 (H) 06/04/2022 0159   TRIG 87 06/04/2022 0159   HDL 49 06/04/2022 0159   CHOLHDL 4.4 06/04/2022 0159   VLDL 17 06/04/2022 0159   LDLCALC 149 (H) 06/04/2022 0159     Risk Assessment/Calculations:                Physical Exam:  VS:  BP 120/60 (BP Location: Right Arm, Patient Position: Sitting, Cuff Size: Normal)   Pulse 67   Ht 5\' 9"  (1.753 m)   Wt 158 lb 3.2 oz (71.8 kg)   SpO2 97%   BMI 23.36 kg/m     Wt Readings from Last 3 Encounters:  06/16/22 158 lb 3.2 oz (71.8 kg)  06/03/22 156 lb (70.8 kg)     GEN:  Well nourished, well developed in no acute distress HEENT: Normal NECK: No JVD; No carotid bruits LYMPHATICS: No lymphadenopathy CARDIAC: RRR, no murmurs, rubs, gallops RESPIRATORY:  Clear to auscultation without rales, wheezing or rhonchi  ABDOMEN: Soft, non-tender, non-distended MUSCULOSKELETAL:  No edema; No deformity  SKIN: Warm and dry NEUROLOGIC:  Alert and oriented x 3 PSYCHIATRIC:  Normal affect   ASSESSMENT:    1. Coronary artery disease involving native coronary artery of native heart with angina pectoris (HCC)   2. Mixed hyperlipidemia   3. Heart failure with mildly reduced ejection fraction (HFmrEF) (HCC)   4. Hyperlipidemia LDL goal <70   5. Stage 3a chronic kidney  disease (HCC)    PLAN:    In order of problems listed above:  CAD-s/p DES to proximal LAD on 06/03/2022; recommendations to continue DAPT x 1 year with aspirin 81 mg daily and Brilinta 90 mg twice daily.  Has nitroglycerin however has not needed.  He does have some breathlessness, advised him this is likely related to his Brilinta it is not overly bothersome to him at this point but if it persists or worsens we did discuss we could possibly bleed changed to Plavix. Hyperlipidemia-LDL was elevated at 149 at hospital admission he was started on high-dose Lipitor.  Continue Lipitor 80 mg daily.  Will repeat FLP and LFTs in 6 weeks. HFmrEF/ischemic cardiomyopathy- Echo completed on 06/04/2022 revealed an EF of 45 to 50%, global hypokinesis, grade 1 DD, mild MR. NYHA class I, asymptomatic, euvolemic.  He is not interested in taking any additional medications at this time.  Will repeat echocardiogram in 6 months.  Was unable to tolerate beta-blocker secondary to orthostatic hypotension. CKD stage III-creatinine 1.33 at discharge, will repeat BMET. Careful titration of diuretic and antihypertensive.    Disposition-repeat BMET.  Repeat LFTs and FLP's in 6 weeks.  Keep follow-up appointment with Dr. Bing Matter on 08/22/2022.           Medication Adjustments/Labs and Tests Ordered: Current medicines are reviewed at length with the patient today.  Concerns regarding medicines are outlined above.  Orders Placed This Encounter  Procedures   Basic metabolic panel   Lipid panel   Hepatic function panel   No orders of the defined types were placed in this encounter.   Patient Instructions  Medication Instructions:  Your physician recommends that you continue on your current medications as directed. Please refer to the Current Medication list given to you today.  *If you need a refill on your cardiac medications before your next appointment, please call your pharmacy*   Lab Work: Your physician  recommends that you return for lab work in: Today for BMP Come back in 6 weeks for Fasting Lipid Panel and Liver Function Lab opens at 8am. You DO NOT NEED an appointment. Best time to come is between 8am and 12noon and between 1:30 and 4:30. If you have been asked to fast for your blood work please have nothing to eat or drink after midnight. You may have water.   If you have labs (blood work) drawn  today and your tests are completely normal, you will receive your results only by: MyChart Message (if you have MyChart) OR A paper copy in the mail If you have any lab test that is abnormal or we need to change your treatment, we will call you to review the results.   Testing/Procedures: NONE   Follow-Up: At [facility], you and your health needs are our priority.  As part of our continuing mission to provide you with exceptional heart care, we have created designated Provider Care Teams.  These Care Teams include your primary Cardiologist (physician) and Advanced Practice Providers (APPs -  Physician Assistants and Nurse Practitioners) who all work together to provide you with the care you need, when you need it.  We recommend signing up for the patient portal called "MyChart".  Sign up information is provided on this After Visit Summary.  MyChart is used to connect with patients for Virtual Visits (Telemedicine).  Patients are able to view lab/test results, encounter notes, upcoming appointments, etc.  Non-urgent messages can be sent to your provider as well.   To learn more about what you can do with MyChart, go to [email or web address].    Your next appointment:  08/22/22 at 3:40    Provider:   Gypsy Balsam, MD    Other Instructions     Signed, Flossie Dibble, NP  06/16/2022 3:56 PM    [facility]

## 2022-06-17 LAB — BASIC METABOLIC PANEL WITH GFR
BUN/Creatinine Ratio: 14 (ref 10–24)
BUN: 16 mg/dL (ref 8–27)
CO2: 21 mmol/L (ref 20–29)
Calcium: 8.9 mg/dL (ref 8.6–10.2)
Chloride: 105 mmol/L (ref 96–106)
Creatinine, Ser: 1.16 mg/dL (ref 0.76–1.27)
Glucose: 91 mg/dL (ref 70–99)
Potassium: 4.5 mmol/L (ref 3.5–5.2)
Sodium: 140 mmol/L (ref 134–144)
eGFR: 66 mL/min/1.73

## 2022-06-29 ENCOUNTER — Encounter: Payer: Self-pay | Admitting: Cardiology

## 2022-06-29 ENCOUNTER — Ambulatory Visit: Payer: Medicare HMO | Attending: Cardiology | Admitting: Cardiology

## 2022-06-29 VITALS — BP 124/70 | HR 74 | Ht 69.0 in | Wt 152.0 lb

## 2022-06-29 DIAGNOSIS — I25119 Atherosclerotic heart disease of native coronary artery with unspecified angina pectoris: Secondary | ICD-10-CM | POA: Diagnosis not present

## 2022-06-29 DIAGNOSIS — I255 Ischemic cardiomyopathy: Secondary | ICD-10-CM | POA: Diagnosis not present

## 2022-06-29 DIAGNOSIS — I2510 Atherosclerotic heart disease of native coronary artery without angina pectoris: Secondary | ICD-10-CM | POA: Insufficient documentation

## 2022-06-29 DIAGNOSIS — I5022 Chronic systolic (congestive) heart failure: Secondary | ICD-10-CM | POA: Diagnosis not present

## 2022-06-29 NOTE — Progress Notes (Signed)
Cardiology Office Note:    Date:  06/29/2022   ID:  Gregory Gibbs, DOB 1945/02/16, MRN [id number]  PCP:  Gregory Pen, MD  Cardiologist:  Gregory Balsam, MD    Referring MD: Gregory Pen, MD   Chief Complaint  Patient presents with   Follow-up    History of Present Illness:    Gregory Gibbs is a 77 y.o. male past medical history significant for coronary disease status post PTCA and stenting of drug-eluting stent to proximal LAD done about a month ago.  He walked into our office today complaining of having some bruising I did look at his bruising.  He does have bruising in right arm as well as forearm however color of those hematomas already bluish and yellowish tells me that this is an old problem.  All processed probably after cardiac catheterization.  He is concerned about his a lot.  He also showed me some bruising on his legs which have very minimal also show me some bruising on the back which I do not see any.  Overall its only very minimal but for him very concerning.  I reassured him that this is going to go away.  Will get CBC today make sure he does not have any problem platelets we will continue Brilinta and aspirin for now.  I explained to him how important is to continue those 2 medications.  He does complain of having some shortness of breath and asking to drink some caffeinated drink I told him if shortness of breath became unbearable he need to let us know then we will switch him from Brilinta to Plavix which I prefer not to do since his lesion is in proximal portion of LAD and myocardial infarction from that area will lead to devastating myocardial infarction.  Past Medical History:  Diagnosis Date   Acquired hypothyroidism 03/25/2014   Acute sinusitis 03/25/2014   Chronic foot pain 03/25/2014   Chronic neck pain 11/04/2014   Cough 03/25/2014   Elevated PSA 02/04/2020   Gastro-esophageal reflux disease without esophagitis 03/25/2014   GERD (gastroesophageal  reflux disease)    History of Helicobacter pylori infection 03/25/2014   Hypothyroidism    Infection due to 2019 novel coronavirus 08/06/2018   Formatting of this note might be different from the original. 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   Rhinosinusitis 11/10/2016   Seasonal allergies 12/07/2018   Seborrheic keratoses 12/07/2018   Syncope and collapse 06/04/2022   Unstable angina (HCC) 06/03/2022   Vitamin D deficiency 03/25/2014    Past Surgical History:  Procedure Laterality Date   CORONARY STENT INTERVENTION N/A 06/03/2022   Procedure: CORONARY STENT INTERVENTION;  Surgeon: Swaziland, Peter M, MD;  Location: [facility] INVASIVE CV LAB;  Service: Cardiovascular;  Laterality: N/A;   LEFT HEART CATH AND CORONARY ANGIOGRAPHY N/A 06/03/2022   Procedure: LEFT HEART CATH AND CORONARY ANGIOGRAPHY;  Surgeon: Swaziland, Peter M, MD;  Location: [facility] INVASIVE CV LAB;  Service: Cardiovascular;  Laterality: N/A;  left inguinal hernia repair Left    2024    Current Medications: Current Meds  Medication Sig   aspirin EC 81 MG tablet Take 1 tablet (81 mg total) by mouth daily. Swallow whole.   atorvastatin (LIPITOR) 80 MG tablet Take 1 tablet (80 mg total) by mouth daily.   Cyanocobalamin 1000 MCG TBCR Take 1 tablet by mouth daily.   levothyroxine (SYNTHROID) 50 MCG tablet Take 50 mcg by mouth every morning.    Multiple Vitamin (MULTIVITAMIN WITH MINERALS) TABS tablet Take 1 tablet by mouth daily.   nitroGLYCERIN (NITROSTAT) 0.4 MG SL tablet Place 1 tablet (0.4 mg total) under the tongue every 5 (five) minutes x 3 doses as needed for chest pain.   ticagrelor (BRILINTA) 90 MG TABS tablet Take 1 tablet (90 mg total) by mouth 2 (two) times daily.     Allergies:   Patient has no known allergies.   Social History   Socioeconomic History   Marital status: Married    Spouse name: Not on file   Number of children: Not on file   Years of education: Not on file   Highest education level: Not on file  Occupational History   Not on file  Tobacco Use   Smoking status: Former    Types: Cigarettes   Smokeless tobacco: Never   Tobacco comments:    Quit in 1977, smoked a total of 12 years  Substance and Sexual Activity   Alcohol use: Never   Drug use: Never   Sexual activity: Not on file  Other Topics Concern   Not on file  Social History Narrative   Not on file   Social Determinants of Health   Financial Resource Strain: Not on file  Food Insecurity: Not on file  Transportation Needs: Not on file  Physical Activity: Not on file  Stress: Not on file  Social Connections: Not on file     Family History: The patient's family history includes Atrial fibrillation in his sister; Dementia in his father; Heart failure in his mother. ROS:   Please see the history of present illness.    All 14 point review of systems negative except as described per history of present illness  EKGs/Labs/Other Studies Reviewed:         Recent Labs: 06/05/2022: Hemoglobin 13.0; Magnesium 2.0; Platelets 221 06/16/2022: BUN 16; Creatinine, Ser 1.16; Potassium 4.5; Sodium 140  Recent Lipid Panel    Component Value Date/Time   CHOL 215 (H) 06/04/2022 0159   TRIG 87 06/04/2022 0159   HDL 49 06/04/2022 0159   CHOLHDL 4.4 06/04/2022 0159   VLDL 17 06/04/2022 0159   LDLCALC 149 (H) 06/04/2022 0159    Physical  Exam:    VS:  BP 124/70 (BP Location: Left Arm, Patient Position: Sitting)   Pulse 74   Ht 5\' 9"  (1.753 m)   Wt 152 lb (68.9 kg)   SpO2 97%   BMI 22.45 kg/m     Wt Readings from Last 3 Encounters:  06/29/22 152 lb (68.9 kg)  06/16/22 158 lb 3.2 oz (71.8 kg)  06/03/22 156 lb (70.8 kg)     GEN:  Well nourished, well developed in no acute distress HEENT: Normal NECK: No JVD; No carotid bruits LYMPHATICS: No lymphadenopathy CARDIAC: RRR, no murmurs, no rubs, no gallops RESPIRATORY:  Clear to auscultation without rales, wheezing or rhonchi  ABDOMEN: Soft, non-tender, non-distended MUSCULOSKELETAL:  No edema; No deformity  SKIN: Warm and dry LOWER EXTREMITIES: no swelling NEUROLOGIC:  Alert and oriented x 3 PSYCHIATRIC:  Normal affect   ASSESSMENT:    1. Coronary artery disease involving native coronary artery of native heart with angina pectoris (HCC)   2. Heart failure with mildly reduced ejection fraction (HFmrEF) (HCC)   3. Ischemic cardiomyopathy    PLAN:    In order of problems listed above:  Coronary disease: Plan dictated in upper portion of the note.   Medication Adjustments/Labs and Tests Ordered: Current medicines are reviewed at length with the patient today.  Concerns regarding medicines are outlined above.  Orders Placed This Encounter  Procedures   CBC   Medication changes: No orders of the defined types were placed in this encounter.   Signed, Georgeanna Lea, MD, [facility] 06/29/2022 3:01 PM     [facility]

## 2022-06-29 NOTE — Patient Instructions (Signed)
Medication Instructions:  Your physician recommends that you continue on your current medications as directed. Please refer to the Current Medication list given to you today.  *If you need a refill on your cardiac medications before your next appointment, please call your pharmacy*   Lab Work: Your physician recommends that you return for lab work in:   Labs today: CBC  If you have labs (blood work) drawn today and your tests are completely normal, you will receive your results only by: MyChart Message (if you have MyChart) OR A paper copy in the mail If you have any lab test that is abnormal or we need to change your treatment, we will call you to review the results.   Testing/Procedures: None   Follow-Up: At [facility], you and your health needs are our priority.  As part of our continuing mission to provide you with exceptional heart care, we have created designated Provider Care Teams.  These Care Teams include your primary Cardiologist (physician) and Advanced Practice Providers (APPs -  Physician Assistants and Nurse Practitioners) who all work together to provide you with the care you need, when you need it.  We recommend signing up for the patient portal called "MyChart".  Sign up information is provided on this After Visit Summary.  MyChart is used to connect with patients for Virtual Visits (Telemedicine).  Patients are able to view lab/test results, encounter notes, upcoming appointments, etc.  Non-urgent messages can be sent to your provider as well.   To learn more about what you can do with MyChart, go to [email or web address].    Your next appointment:   Follow up as needed  Provider:   Gypsy Balsam, MD  Other Instructions None

## 2022-06-30 LAB — CBC
Hematocrit: 41.7 % (ref 37.5–51.0)
Hemoglobin: 13.9 g/dL (ref 13.0–17.7)
MCH: 29.2 pg (ref 26.6–33.0)
MCHC: 33.3 g/dL (ref 31.5–35.7)
MCV: 88 fL (ref 79–97)
Platelets: 233 10*3/uL (ref 150–450)
RBC: 4.76 x10E6/uL (ref 4.14–5.80)
RDW: 12.3 % (ref 11.6–15.4)
WBC: 7.3 10*3/uL (ref 3.4–10.8)

## 2022-07-18 DIAGNOSIS — I25119 Atherosclerotic heart disease of native coronary artery with unspecified angina pectoris: Secondary | ICD-10-CM | POA: Diagnosis not present

## 2022-07-18 DIAGNOSIS — E782 Mixed hyperlipidemia: Secondary | ICD-10-CM | POA: Diagnosis not present

## 2022-07-18 DIAGNOSIS — E785 Hyperlipidemia, unspecified: Secondary | ICD-10-CM | POA: Diagnosis not present

## 2022-07-18 DIAGNOSIS — I5022 Chronic systolic (congestive) heart failure: Secondary | ICD-10-CM | POA: Diagnosis not present

## 2022-07-19 LAB — HEPATIC FUNCTION PANEL
ALT: 24 IU/L (ref 0–44)
AST: 25 IU/L (ref 0–40)
Albumin: 4 g/dL (ref 3.8–4.8)
Alkaline Phosphatase: 87 IU/L (ref 44–121)
Bilirubin Total: 0.8 mg/dL (ref 0.0–1.2)
Bilirubin, Direct: 0.22 mg/dL (ref 0.00–0.40)
Total Protein: 6.2 g/dL (ref 6.0–8.5)

## 2022-07-19 LAB — LIPID PANEL
Chol/HDL Ratio: 2.3 ratio (ref 0.0–5.0)
Cholesterol, Total: 101 mg/dL (ref 100–199)
HDL: 43 mg/dL
LDL Chol Calc (NIH): 43 mg/dL (ref 0–99)
Triglycerides: 72 mg/dL (ref 0–149)
VLDL Cholesterol Cal: 15 mg/dL (ref 5–40)

## 2022-08-10 ENCOUNTER — Other Ambulatory Visit: Payer: Self-pay

## 2022-08-10 ENCOUNTER — Emergency Department (HOSPITAL_COMMUNITY): Payer: Medicare HMO

## 2022-08-10 ENCOUNTER — Inpatient Hospital Stay (HOSPITAL_COMMUNITY): Payer: Medicare HMO | Admitting: Anesthesiology

## 2022-08-10 ENCOUNTER — Encounter (HOSPITAL_COMMUNITY): Payer: Self-pay | Admitting: Orthopedic Surgery

## 2022-08-10 ENCOUNTER — Inpatient Hospital Stay (HOSPITAL_COMMUNITY)
Admission: EM | Admit: 2022-08-10 | Discharge: 2022-08-18 | DRG: 481 | Disposition: A | Discharge status: Skilled Nursing Facility | Payer: Medicare HMO | Attending: Family Medicine | Admitting: Family Medicine

## 2022-08-10 ENCOUNTER — Inpatient Hospital Stay (HOSPITAL_COMMUNITY): Payer: Medicare HMO

## 2022-08-10 ENCOUNTER — Encounter (HOSPITAL_COMMUNITY)
Admission: EM | Disposition: A | Discharge status: Skilled Nursing Facility | Payer: Self-pay | Source: Home / Self Care | Attending: Internal Medicine

## 2022-08-10 DIAGNOSIS — Z79890 Hormone replacement therapy (postmenopausal): Secondary | ICD-10-CM | POA: Diagnosis not present

## 2022-08-10 DIAGNOSIS — Z8249 Family history of ischemic heart disease and other diseases of the circulatory system: Secondary | ICD-10-CM | POA: Diagnosis not present

## 2022-08-10 DIAGNOSIS — I5022 Chronic systolic (congestive) heart failure: Secondary | ICD-10-CM | POA: Diagnosis present

## 2022-08-10 DIAGNOSIS — E039 Hypothyroidism, unspecified: Secondary | ICD-10-CM

## 2022-08-10 DIAGNOSIS — D649 Anemia, unspecified: Secondary | ICD-10-CM | POA: Diagnosis present

## 2022-08-10 DIAGNOSIS — Z01810 Encounter for preprocedural cardiovascular examination: Secondary | ICD-10-CM | POA: Diagnosis not present

## 2022-08-10 DIAGNOSIS — Z87891 Personal history of nicotine dependence: Secondary | ICD-10-CM

## 2022-08-10 DIAGNOSIS — W132XXA Fall from, out of or through roof, initial encounter: Secondary | ICD-10-CM | POA: Diagnosis not present

## 2022-08-10 DIAGNOSIS — Z743 Need for continuous supervision: Secondary | ICD-10-CM | POA: Diagnosis not present

## 2022-08-10 DIAGNOSIS — S72351A Displaced comminuted fracture of shaft of right femur, initial encounter for closed fracture: Secondary | ICD-10-CM | POA: Diagnosis not present

## 2022-08-10 DIAGNOSIS — S7291XA Unspecified fracture of right femur, initial encounter for closed fracture: Secondary | ICD-10-CM | POA: Diagnosis not present

## 2022-08-10 DIAGNOSIS — I25110 Atherosclerotic heart disease of native coronary artery with unstable angina pectoris: Secondary | ICD-10-CM

## 2022-08-10 DIAGNOSIS — I491 Atrial premature depolarization: Secondary | ICD-10-CM | POA: Diagnosis not present

## 2022-08-10 DIAGNOSIS — S7221XA Displaced subtrochanteric fracture of right femur, initial encounter for closed fracture: Secondary | ICD-10-CM | POA: Diagnosis present

## 2022-08-10 DIAGNOSIS — E782 Mixed hyperlipidemia: Secondary | ICD-10-CM | POA: Diagnosis not present

## 2022-08-10 DIAGNOSIS — Z955 Presence of coronary angioplasty implant and graft: Secondary | ICD-10-CM | POA: Diagnosis not present

## 2022-08-10 DIAGNOSIS — E871 Hypo-osmolality and hyponatremia: Secondary | ICD-10-CM | POA: Diagnosis present

## 2022-08-10 DIAGNOSIS — I25119 Atherosclerotic heart disease of native coronary artery with unspecified angina pectoris: Secondary | ICD-10-CM | POA: Diagnosis not present

## 2022-08-10 DIAGNOSIS — M5030 Other cervical disc degeneration, unspecified cervical region: Secondary | ICD-10-CM | POA: Diagnosis not present

## 2022-08-10 DIAGNOSIS — S72001A Fracture of unspecified part of neck of right femur, initial encounter for closed fracture: Secondary | ICD-10-CM | POA: Diagnosis not present

## 2022-08-10 DIAGNOSIS — N1831 Chronic kidney disease, stage 3a: Secondary | ICD-10-CM | POA: Diagnosis not present

## 2022-08-10 DIAGNOSIS — I129 Hypertensive chronic kidney disease with stage 1 through stage 4 chronic kidney disease, or unspecified chronic kidney disease: Secondary | ICD-10-CM | POA: Diagnosis not present

## 2022-08-10 DIAGNOSIS — N183 Chronic kidney disease, stage 3 unspecified: Secondary | ICD-10-CM | POA: Diagnosis not present

## 2022-08-10 DIAGNOSIS — R911 Solitary pulmonary nodule: Secondary | ICD-10-CM

## 2022-08-10 DIAGNOSIS — S7291XD Unspecified fracture of right femur, subsequent encounter for closed fracture with routine healing: Secondary | ICD-10-CM | POA: Diagnosis not present

## 2022-08-10 DIAGNOSIS — I2583 Coronary atherosclerosis due to lipid rich plaque: Secondary | ICD-10-CM | POA: Diagnosis not present

## 2022-08-10 DIAGNOSIS — E785 Hyperlipidemia, unspecified: Secondary | ICD-10-CM | POA: Diagnosis present

## 2022-08-10 DIAGNOSIS — M47812 Spondylosis without myelopathy or radiculopathy, cervical region: Secondary | ICD-10-CM | POA: Diagnosis not present

## 2022-08-10 DIAGNOSIS — Z043 Encounter for examination and observation following other accident: Secondary | ICD-10-CM | POA: Diagnosis not present

## 2022-08-10 DIAGNOSIS — S199XXA Unspecified injury of neck, initial encounter: Secondary | ICD-10-CM | POA: Diagnosis not present

## 2022-08-10 DIAGNOSIS — Z7982 Long term (current) use of aspirin: Secondary | ICD-10-CM | POA: Diagnosis not present

## 2022-08-10 DIAGNOSIS — S72143A Displaced intertrochanteric fracture of unspecified femur, initial encounter for closed fracture: Secondary | ICD-10-CM | POA: Diagnosis not present

## 2022-08-10 DIAGNOSIS — I2510 Atherosclerotic heart disease of native coronary artery without angina pectoris: Secondary | ICD-10-CM | POA: Diagnosis present

## 2022-08-10 DIAGNOSIS — S72009A Fracture of unspecified part of neck of unspecified femur, initial encounter for closed fracture: Secondary | ICD-10-CM | POA: Diagnosis not present

## 2022-08-10 DIAGNOSIS — Z79899 Other long term (current) drug therapy: Secondary | ICD-10-CM

## 2022-08-10 DIAGNOSIS — I130 Hypertensive heart and chronic kidney disease with heart failure and stage 1 through stage 4 chronic kidney disease, or unspecified chronic kidney disease: Secondary | ICD-10-CM | POA: Diagnosis not present

## 2022-08-10 DIAGNOSIS — Z8616 Personal history of COVID-19: Secondary | ICD-10-CM | POA: Diagnosis not present

## 2022-08-10 DIAGNOSIS — K5900 Constipation, unspecified: Secondary | ICD-10-CM | POA: Diagnosis present

## 2022-08-10 DIAGNOSIS — I255 Ischemic cardiomyopathy: Secondary | ICD-10-CM | POA: Diagnosis present

## 2022-08-10 DIAGNOSIS — D72829 Elevated white blood cell count, unspecified: Secondary | ICD-10-CM | POA: Diagnosis present

## 2022-08-10 DIAGNOSIS — K219 Gastro-esophageal reflux disease without esophagitis: Secondary | ICD-10-CM | POA: Diagnosis not present

## 2022-08-10 DIAGNOSIS — Z8619 Personal history of other infectious and parasitic diseases: Secondary | ICD-10-CM

## 2022-08-10 DIAGNOSIS — Z7902 Long term (current) use of antithrombotics/antiplatelets: Secondary | ICD-10-CM

## 2022-08-10 DIAGNOSIS — T148XXA Other injury of unspecified body region, initial encounter: Secondary | ICD-10-CM

## 2022-08-10 DIAGNOSIS — D62 Acute posthemorrhagic anemia: Secondary | ICD-10-CM | POA: Diagnosis not present

## 2022-08-10 DIAGNOSIS — S0990XA Unspecified injury of head, initial encounter: Secondary | ICD-10-CM | POA: Diagnosis not present

## 2022-08-10 DIAGNOSIS — N501 Vascular disorders of male genital organs: Secondary | ICD-10-CM | POA: Diagnosis present

## 2022-08-10 DIAGNOSIS — S79929A Unspecified injury of unspecified thigh, initial encounter: Secondary | ICD-10-CM | POA: Diagnosis not present

## 2022-08-10 DIAGNOSIS — E559 Vitamin D deficiency, unspecified: Secondary | ICD-10-CM | POA: Diagnosis present

## 2022-08-10 DIAGNOSIS — D696 Thrombocytopenia, unspecified: Secondary | ICD-10-CM | POA: Diagnosis not present

## 2022-08-10 DIAGNOSIS — S79911A Unspecified injury of right hip, initial encounter: Secondary | ICD-10-CM | POA: Diagnosis not present

## 2022-08-10 DIAGNOSIS — S72141A Displaced intertrochanteric fracture of right femur, initial encounter for closed fracture: Principal | ICD-10-CM

## 2022-08-10 DIAGNOSIS — S72141D Displaced intertrochanteric fracture of right femur, subsequent encounter for closed fracture with routine healing: Secondary | ICD-10-CM | POA: Diagnosis not present

## 2022-08-10 HISTORY — PX: INTRAMEDULLARY (IM) NAIL INTERTROCHANTERIC: SHX5875

## 2022-08-10 LAB — I-STAT CHEM 8, ED
BUN: 10 mg/dL (ref 8–23)
Calcium, Ion: 1.07 mmol/L — ABNORMAL LOW (ref 1.15–1.40)
Chloride: 107 mmol/L (ref 98–111)
Creatinine, Ser: 1.3 mg/dL — ABNORMAL HIGH (ref 0.61–1.24)
Glucose, Bld: 119 mg/dL — ABNORMAL HIGH (ref 70–99)
HCT: 29 % — ABNORMAL LOW (ref 39.0–52.0)
Hemoglobin: 9.9 g/dL — ABNORMAL LOW (ref 13.0–17.0)
Potassium: 3.6 mmol/L (ref 3.5–5.1)
Sodium: 140 mmol/L (ref 135–145)
TCO2: 22 mmol/L (ref 22–32)

## 2022-08-10 LAB — COMPREHENSIVE METABOLIC PANEL
ALT: 25 U/L (ref 0–44)
AST: 36 U/L (ref 15–41)
Albumin: 2.7 g/dL — ABNORMAL LOW (ref 3.5–5.0)
Alkaline Phosphatase: 49 U/L (ref 38–126)
Anion gap: 7 (ref 5–15)
BUN: 9 mg/dL (ref 8–23)
CO2: 19 mmol/L — ABNORMAL LOW (ref 22–32)
Calcium: 6.6 mg/dL — ABNORMAL LOW (ref 8.9–10.3)
Chloride: 112 mmol/L — ABNORMAL HIGH (ref 98–111)
Creatinine, Ser: 1.14 mg/dL (ref 0.61–1.24)
GFR, Estimated: >60 mL/min (ref >60)
Glucose, Bld: 108 mg/dL — ABNORMAL HIGH (ref 70–99)
Potassium: 3 mmol/L — ABNORMAL LOW (ref 3.5–5.1)
Sodium: 138 mmol/L (ref 135–145)
Total Bilirubin: 1.2 mg/dL (ref 0.3–1.2)
Total Protein: 4.4 g/dL — ABNORMAL LOW (ref 6.5–8.1)

## 2022-08-10 LAB — CBC WITH DIFFERENTIAL/PLATELET
Abs Immature Granulocytes: 0.08 10*3/uL — ABNORMAL HIGH (ref 0.00–0.07)
Basophils Absolute: 0.1 10*3/uL (ref 0.0–0.1)
Basophils Relative: 0 %
Eosinophils Absolute: 0.5 10*3/uL (ref 0.0–0.5)
Eosinophils Relative: 3 %
HCT: 31.6 % — ABNORMAL LOW (ref 39.0–52.0)
Hemoglobin: 10.6 g/dL — ABNORMAL LOW (ref 13.0–17.0)
Immature Granulocytes: 1 %
Lymphocytes Relative: 13 %
Lymphs Abs: 2 10*3/uL (ref 0.7–4.0)
MCH: 29.8 pg (ref 26.0–34.0)
MCHC: 33.5 g/dL (ref 30.0–36.0)
MCV: 88.8 fL (ref 80.0–100.0)
Monocytes Absolute: 1.2 10*3/uL — ABNORMAL HIGH (ref 0.1–1.0)
Monocytes Relative: 8 %
Neutro Abs: 11.7 10*3/uL — ABNORMAL HIGH (ref 1.7–7.7)
Neutrophils Relative %: 75 %
Platelets: 192 10*3/uL (ref 150–400)
RBC: 3.56 MIL/uL — ABNORMAL LOW (ref 4.22–5.81)
RDW: 12 % (ref 11.5–15.5)
WBC: 15.4 10*3/uL — ABNORMAL HIGH (ref 4.0–10.5)
nRBC: 0 % (ref 0.0–0.2)

## 2022-08-10 LAB — URINALYSIS, ROUTINE W REFLEX MICROSCOPIC
Bacteria, UA: NONE SEEN
Bilirubin Urine: NEGATIVE
Glucose, UA: NEGATIVE mg/dL
Ketones, ur: 20 mg/dL — AB
Leukocytes,Ua: NEGATIVE
Nitrite: NEGATIVE
Protein, ur: NEGATIVE mg/dL
Specific Gravity, Urine: >1.046 — ABNORMAL HIGH (ref 1.005–1.030)
pH: 5 (ref 5.0–8.0)

## 2022-08-10 LAB — POCT I-STAT, CHEM 8
BUN: 13 mg/dL (ref 8–23)
Calcium, Ion: 1.19 mmol/L (ref 1.15–1.40)
Chloride: 105 mmol/L (ref 98–111)
Creatinine, Ser: 1.2 mg/dL (ref 0.61–1.24)
Glucose, Bld: 117 mg/dL — ABNORMAL HIGH (ref 70–99)
HCT: 29 % — ABNORMAL LOW (ref 39.0–52.0)
Hemoglobin: 9.9 g/dL — ABNORMAL LOW (ref 13.0–17.0)
Potassium: 4.4 mmol/L (ref 3.5–5.1)
Sodium: 139 mmol/L (ref 135–145)
TCO2: 24 mmol/L (ref 22–32)

## 2022-08-10 LAB — ABO/RH: ABO/RH(D): A NEG

## 2022-08-10 LAB — TYPE AND SCREEN
ABO/RH(D): A NEG
Antibody Screen: NEGATIVE

## 2022-08-10 LAB — SURGICAL PCR SCREEN
MRSA, PCR: NEGATIVE
Staphylococcus aureus: NEGATIVE

## 2022-08-10 SURGERY — FIXATION, FRACTURE, INTERTROCHANTERIC, WITH INTRAMEDULLARY ROD
Anesthesia: General | Site: Leg Upper | Laterality: Right

## 2022-08-10 MED ORDER — CEFAZOLIN SODIUM-DEXTROSE 2-4 GM/100ML-% IV SOLN: 2.0000 g | INTRAVENOUS | Status: DC

## 2022-08-10 MED ORDER — PROPOFOL 10 MG/ML IV BOLUS
INTRAVENOUS | Status: AC
  Filled 2022-08-10: qty 20

## 2022-08-10 MED ORDER — ETOMIDATE 2 MG/ML IV SOLN
INTRAVENOUS | Status: AC
  Filled 2022-08-10: qty 10

## 2022-08-10 MED ORDER — ENOXAPARIN SODIUM 40 MG/0.4ML IJ SOSY: 40.0000 mg | PREFILLED_SYRINGE | INTRAMUSCULAR | Status: DC

## 2022-08-10 MED ORDER — ONDANSETRON HCL 4 MG/2ML IJ SOLN: 4.0000 mg | Freq: Four times a day (QID) | INTRAMUSCULAR | Status: DC | PRN

## 2022-08-10 MED ORDER — CHLORHEXIDINE GLUCONATE 4 % EX SOLN
60.0000 mL | Freq: Once | CUTANEOUS | Status: AC
  Administered 2022-08-10: 4 via TOPICAL

## 2022-08-10 MED ORDER — LIDOCAINE 2% (20 MG/ML) 5 ML SYRINGE
INTRAMUSCULAR | Status: AC
  Filled 2022-08-10: qty 5

## 2022-08-10 MED ORDER — FENTANYL CITRATE PF 50 MCG/ML IJ SOSY: 25.0000 ug | PREFILLED_SYRINGE | Freq: Once | INTRAMUSCULAR | Status: AC

## 2022-08-10 MED ORDER — FENTANYL CITRATE PF 50 MCG/ML IJ SOSY
PREFILLED_SYRINGE | INTRAMUSCULAR | Status: AC
  Administered 2022-08-10: 25 ug via INTRAVENOUS
  Filled 2022-08-10: qty 1

## 2022-08-10 MED ORDER — MORPHINE SULFATE (PF) 2 MG/ML IV SOLN
2.0000 mg | INTRAVENOUS | Status: DC | PRN
  Administered 2022-08-10: 2 mg via INTRAVENOUS
  Filled 2022-08-10: qty 1

## 2022-08-10 MED ORDER — FENTANYL CITRATE (PF) 250 MCG/5ML IJ SOLN
INTRAMUSCULAR | Status: AC
  Filled 2022-08-10: qty 5

## 2022-08-10 MED ORDER — IOHEXOL 350 MG/ML SOLN
75.0000 mL | Freq: Once | INTRAVENOUS | Status: AC | PRN
  Administered 2022-08-10: 75 mL via INTRAVENOUS

## 2022-08-10 MED ORDER — METHOCARBAMOL 1000 MG/10ML IJ SOLN: 500.0000 mg | Freq: Four times a day (QID) | INTRAVENOUS | Status: DC | PRN

## 2022-08-10 MED ORDER — ONDANSETRON HCL 4 MG PO TABS: 4.0000 mg | ORAL_TABLET | Freq: Four times a day (QID) | ORAL | Status: DC | PRN

## 2022-08-10 MED ORDER — OXYCODONE HCL 5 MG PO TABS: 5.0000 mg | ORAL_TABLET | ORAL | Status: DC | PRN

## 2022-08-10 MED ORDER — METHOCARBAMOL 500 MG PO TABS: 500.0000 mg | ORAL_TABLET | Freq: Four times a day (QID) | ORAL | Status: DC | PRN

## 2022-08-10 MED ORDER — POVIDONE-IODINE 10 % EX SWAB: 2.0000 | Freq: Once | CUTANEOUS | Status: DC

## 2022-08-10 SURGICAL SUPPLY — 24 items
BAG COUNTER SPONGE SURGICOUNT (BAG) ×1 IMPLANT
BAG SPNG CNTER NS LX DISP (BAG) ×1
COVER SURGICAL LIGHT HANDLE (MISCELLANEOUS) ×1 IMPLANT
DRSG AQUACEL AG ADV 3.5X 6 (GAUZE/BANDAGES/DRESSINGS) ×2 IMPLANT
DRSG XEROFORM 1X8 (GAUZE/BANDAGES/DRESSINGS) IMPLANT
ELECT REM PT RETURN 9FT ADLT (ELECTROSURGICAL) ×1
ELECTRODE REM PT RTRN 9FT ADLT (ELECTROSURGICAL) ×1 IMPLANT
GAUZE SPONGE 4X4 12PLY STRL (GAUZE/BANDAGES/DRESSINGS) IMPLANT
GAUZE SPONGE 4X4 16PLY XRAY LF (GAUZE/BANDAGES/DRESSINGS) IMPLANT
GLOVE BIO SURGEON STRL SZ 6.5 (GLOVE) ×2 IMPLANT
GLOVE BIOGEL PI IND STRL 6.5 (GLOVE) ×1 IMPLANT
GLOVE BIOGEL PI IND STRL 7.5 (GLOVE) ×1 IMPLANT
GLOVE ORTHO TXT STRL SZ7.5 (GLOVE) ×2 IMPLANT
GOWN STRL REUS W/ TWL LRG LVL3 (GOWN DISPOSABLE) ×2 IMPLANT
GOWN STRL REUS W/TWL LRG LVL3 (GOWN DISPOSABLE) ×2
KIT TURNOVER KIT B (KITS) ×1 IMPLANT
MANIFOLD NEPTUNE II (INSTRUMENTS) ×1 IMPLANT
NS IRRIG 1000ML POUR BTL (IV SOLUTION) ×1 IMPLANT
PACK BASIC III (CUSTOM PROCEDURE TRAY) ×1
PACK SRG BSC III STRL LF ECLPS (CUSTOM PROCEDURE TRAY) IMPLANT
PAD ARMBOARD 7.5X6 YLW CONV (MISCELLANEOUS) ×2 IMPLANT
TOWEL GREEN STERILE (TOWEL DISPOSABLE) ×1 IMPLANT
TOWEL GREEN STERILE FF (TOWEL DISPOSABLE) ×1 IMPLANT
WATER STERILE IRR 1000ML POUR (IV SOLUTION) ×1 IMPLANT

## 2022-08-10 NOTE — ED Provider Notes (Signed)
[place] EMERGENCY DEPARTMENT AT [facility] Provider Note   CSN: [id number] Arrival date & time: 08/10/22  1531     History  Chief Complaint  Patient presents with   Gregory Gibbs    Gregory Gibbs is a 77 y.o. male, history of CAD on Brilinta, who presents to the ED secondary to a fall off his roof, while cleaning out the gutters about an hour ago.  He states he was cleaning out the gutters, and he actually misstep, and fell off the roof and landed right on his right hip.  Denies any head pain, neck pain, abdominal pain, chest pain.  States his right hip hurts quite a bit, and it hurts down his entire leg when he moves.  Is unable to bear weight at this time.  Denies any nausea, vomiting or confusion.  Did not hit his head immediately after falling and hitting his head.  No loss of consciousness.  Reports he was 10 to 11 feet off the ground.  Brought in by EMS given 100 mcg of fentanyl in field.    Home Medications Prior to Admission medications   Medication Sig Start Date End Date Taking? Authorizing Provider  aspirin EC 81 MG tablet Take 1 tablet (81 mg total) by mouth daily. Swallow whole. 06/06/22  Yes Strader, [place] M, PA-C  atorvastatin (LIPITOR) 80 MG tablet Take 1 tablet (80 mg total) by mouth daily. 06/06/22  Yes Strader, Lennart Pall, PA-C  Cyanocobalamin 1000 MCG TBCR Take 1 tablet by mouth daily. 08/26/09  Yes [provider]  levothyroxine (SYNTHROID) 50 MCG tablet Take 50 mcg by mouth every morning.   Yes [provider]  Multiple Vitamin (MULTIVITAMIN WITH MINERALS) TABS tablet Take 1 tablet by mouth daily.   Yes [provider]  pantoprazole (PROTONIX) 40 MG tablet Take 40 mg by mouth daily.   Yes [provider]  ticagrelor (BRILINTA) 90 MG TABS tablet Take 1 tablet (90 mg total) by mouth 2 (two) times daily. 06/05/22  Yes Strader, [place] M, PA-C  nitroGLYCERIN (NITROSTAT) 0.4 MG SL tablet Place 1 tablet (0.4 mg total) under the  tongue every 5 (five) minutes x 3 doses as needed for chest pain. Patient not taking: Reported on 08/10/2022 06/05/22   [name], PA-C      Allergies    Patient has no known allergies.    Review of Systems   Review of Systems  Cardiovascular:  Negative for chest pain.  Gastrointestinal:  Negative for abdominal pain.  Musculoskeletal:        +R hip pain    Physical Exam Updated Vital Signs BP 118/78 (BP Location: Right Arm)   Pulse 71   Resp 16   Ht 5\' 9"  (1.753 m)   Wt 68 kg   SpO2 100%   BMI 22.14 kg/m  Physical Exam Vitals and nursing note reviewed.  Constitutional:      General: He is not in acute distress.    Appearance: He is well-developed.  HENT:     Head: Normocephalic and atraumatic.  Eyes:     Conjunctiva/sclera: Conjunctivae normal.  Cardiovascular:     Rate and Rhythm: Normal rate and regular rhythm.     Heart sounds: No murmur heard. Pulmonary:     Effort: Pulmonary effort is normal. No respiratory distress.     Breath sounds: Normal breath sounds.  Abdominal:     Palpations: Abdomen is soft.     Tenderness: There is no abdominal tenderness.  Musculoskeletal:        General: No swelling.     Cervical back: Neck supple.     Comments: Tenderness to palpation of right hip, and femur.  Mild shortening, however unable to flex or extend leg.  Positive dorsalis pedis pulse.  Good sensation.  Skin:    General: Skin is warm and dry.     Capillary Refill: Capillary refill takes less than 2 seconds.  Neurological:     Mental Status: He is alert.  Psychiatric:        Mood and Affect: Mood normal.     ED Results / Procedures / Treatments   Labs (all labs ordered are listed, but only abnormal results are displayed) Labs Reviewed  CBC WITH DIFFERENTIAL/PLATELET - Abnormal; Notable for the following components:      Result Value   WBC 15.4 (*)    RBC 3.56 (*)    Hemoglobin 10.6 (*)    HCT 31.6 (*)    Neutro Abs 11.7 (*)    Monocytes Absolute 1.2  (*)    Abs Immature Granulocytes 0.08 (*)    All other components within normal limits  COMPREHENSIVE METABOLIC PANEL - Abnormal; Notable for the following components:   Potassium 3.0 (*)    Chloride 112 (*)    CO2 19 (*)    Glucose, Bld 108 (*)    Calcium 6.6 (*)    Total Protein 4.4 (*)    Albumin 2.7 (*)    All other components within normal limits  I-STAT CHEM 8, ED - Abnormal; Notable for the following components:   Creatinine, Ser 1.30 (*)    Glucose, Bld 119 (*)    Calcium, Ion 1.07 (*)    Hemoglobin 9.9 (*)    HCT 29.0 (*)    All other components within normal limits  SURGICAL PCR SCREEN  URINALYSIS, ROUTINE W REFLEX MICROSCOPIC  TYPE AND SCREEN  ABO/RH    EKG None  Radiology CT CHEST ABDOMEN PELVIS W CONTRAST  Result Date: 08/10/2022 CLINICAL DATA:  Fall from roof cleaning gutters. EXAM: CT CHEST, ABDOMEN, AND PELVIS WITH CONTRAST TECHNIQUE: Multidetector CT imaging of the chest, abdomen and pelvis was performed following the standard protocol during bolus administration of intravenous contrast. RADIATION DOSE REDUCTION: This exam was performed according to the departmental dose-optimization program which includes automated exposure control, adjustment of the mA and/or kV according to patient size and/or use of iterative reconstruction technique. CONTRAST:  75mL OMNIPAQUE IOHEXOL 350 MG/ML SOLN COMPARISON:  None Available. FINDINGS: CT CHEST FINDINGS Cardiovascular: No aortic injury. No mediastinal hematoma. Great vessels normal. Mediastinum/Nodes: Trachea and esophagus normal. Lungs/Pleura: No pneumothorax.  No pulmonary contusion. 7 mm lingular pulmonary nodule.  (Image 101/series 4) Musculoskeletal: No fracture CT ABDOMEN AND PELVIS FINDINGS Hepatobiliary: No hepatic laceration. Pancreas: Normal pancreas. Spleen: No splenic laceration. Adrenals/urinary tract: Adrenal glands normal. Kidneys enhance symmetrically. Bladder intact. Stomach/Bowel: No bowel injury identified.  No  mesenteric fluid. Vascular/Lymphatic: Abdominal aorta normal caliber. Iliac arteries normal. Reproductive: Other: No free fluid. Musculoskeletal: Comminuted fracture of the proximal RIGHT femur diaphysis and extending into intertrochanteric femur. The femoral head is located and varus angulated. No acetabular fracture identified. Intramuscular hematoma surrounds the comminuted femur fracture on the RIGHT. No evidence of active arterial bleeding. No pelvic fracture or sacral fracture identified. IMPRESSION: CHEST: 1. No thoracic injury. 2. Left solid pulmonary nodule measuring 7 mm. Per Fleischner Society Guidelines, recommend a non-contrast Chest CT at 6-12 months. If patient is high risk for malignancy,  consider an additional non-contrast Chest CT at 18-24 months. If patient is low risk for malignancy, non-contrast Chest CT at 18-24 months is optional. These guidelines do not apply to immunocompromised patients and patients with cancer. Follow up in patients with significant comorbidities as clinically warranted. For lung cancer screening, adhere to Lung-RADS guidelines. Reference: Radiology. 2017; 284(1):228-43. PELVIS: 1. Comminuted fracture of the proximal RIGHT femur diaphysis and extending into the intertrochanteric femur. Femoral head is located and varus angulated. 2. Large intramuscular hematoma surrounds the proximal RIGHT femur fracture. 3. No evidence of solid organ injury. Electronically Signed   By: Genevive Bi M.D.   On: 08/10/2022 17:47   DG Femur Min 2 Views Right  Result Date: 08/10/2022 CLINICAL DATA:  Right hip pain after fall off roof. EXAM: RIGHT FEMUR 2 VIEWS COMPARISON:  None Available. FINDINGS: Severely displaced and comminuted fracture is seen involving the intertrochanteric region and proximal shaft of the right femur. IMPRESSION: Severely displaced and comminuted proximal right femoral fracture as noted above. Electronically Signed   By: Lupita Raider M.D.   On: 08/10/2022  16:19   DG Chest Portable 1 View  Result Date: 08/10/2022 CLINICAL DATA:  Fall. EXAM: PORTABLE CHEST 1 VIEW COMPARISON:  Jun 02, 2022. FINDINGS: The heart size and mediastinal contours are within normal limits. Both lungs are clear. The visualized skeletal structures are unremarkable. IMPRESSION: No active disease. Electronically Signed   By: Lupita Raider M.D.   On: 08/10/2022 16:18   DG Pelvis Portable  Result Date: 08/10/2022 CLINICAL DATA:  Right hip pain after fall off roof. EXAM: PORTABLE PELVIS 1-2 VIEWS COMPARISON:  None Available. FINDINGS: Severely displaced and comminuted fracture is seen involving the intertrochanteric region and proximal shaft of the right femur. IMPRESSION: Severely displaced and comminuted proximal right femoral fracture as noted above. Electronically Signed   By: Lupita Raider M.D.   On: 08/10/2022 16:17    Procedures Procedures    Medications Ordered in ED Medications  enoxaparin (LOVENOX) injection 40 mg (has no administration in time range)  methocarbamol (ROBAXIN) tablet 500 mg (has no administration in time range)    Or  methocarbamol (ROBAXIN) 500 mg in dextrose 5 % 50 mL IVPB (has no administration in time range)  ondansetron (ZOFRAN) tablet 4 mg (has no administration in time range)    Or  ondansetron (ZOFRAN) injection 4 mg (has no administration in time range)  oxyCODONE (Oxy IR/ROXICODONE) immediate release tablet 5-15 mg (has no administration in time range)  morphine (PF) 2 MG/ML injection 2 mg (2 mg Intravenous Given 08/10/22 1639)  fentaNYL (SUBLIMAZE) injection 25 mcg (25 mcg Intravenous Given by Other 08/10/22 1604)  iohexol (OMNIPAQUE) 350 MG/ML injection 75 mL (75 mLs Intravenous Contrast Given 08/10/22 1725)    ED Course/ Medical Decision Making/ A&P                                 Medical Decision Making Patient is a 77 year old male, here for evaluation after he slipped off the roof while cleaning his gutters.  He has shortening of  his right lower extremity, with some tenderness to palpation of the right hip.  We will obtain pelvic x-ray, chest x-ray, CT chest/abdomen/pelvis, head CT, and cervical spine, for further evaluation as I am suspicious he likely has a femoral fracture, but may this may be a distracting injury.  Staffed with Dr. Rush Landmark.  No evidence of  any wounds.  No neurodeficits  Amount and/or Complexity of Data Reviewed Labs: ordered.    Details: Labs are fairly unremarkable Radiology: ordered.    Details: CT chest/abdomen/pelvis shows large intramuscular hematoma, as well as right displaced, comminuted, proximal, and intertrochanteric femur.  Head CT, neck CT shows no evidence of head bleed, or fracture Discussion of management or test interpretation with external provider(s): Seen by PA, Earney Hamburg, Dr. Charlann Boxer accepts admission to the patient, for comminuted displaced, femoral fracture.  No wounds, noted, will hold off on tetanus at this time.  No evidence of any kind of intra-abdominal bleeding, other than a hematoma, noted to the site of the fracture.  Orthopedics to further manage  Risk Prescription drug management. Decision regarding hospitalization.   Final Clinical Impression(s) / ED Diagnoses Final diagnoses:  Closed displaced intertrochanteric fracture of right femur, initial encounter (HCC)  Intramuscular hematoma  Pulmonary nodule    Rx / DC Orders ED Discharge Orders     None         Pete Pelt, PA 08/10/22 1805    Tegeler, Canary Brim, MD 08/10/22 [phone]

## 2022-08-10 NOTE — ED Notes (Signed)
ED TO INPATIENT HANDOFF REPORT  ED Nurse Name and Phone #: [phone]  S Name/Age/Gender Gregory Gibbs 77 y.o. male Room/Bed: 010C/010C  Code Status   Code Status: Full Code  Home/SNF/Other Home Patient oriented to: self, place, time, and situation Is this baseline? Yes   Triage Complete: Triage complete  Chief Complaint Closed fracture of right femur (HCC) [S72.91XA]  Triage Note Pt to the ed from home with a CC of fall off roof while cleaning gutters. Pt fell about 10 feet onto his rights hip. Pt relays he did hit his head but did not have LOC. Pt is on blood thinners. A&Ox 4   Allergies No Known Allergies  Level of Care/Admitting Diagnosis ED Disposition     ED Disposition  Admit   Condition  --   Comment  Hospital Area: [facility] [[id number]]  Level of Care: Med-Surg [16]  May admit patient to [facility] or [facility] if equivalent level of care is available:: No  Covid Evaluation: Asymptomatic - no recent exposure (last 10 days) testing not required  Diagnosis: Closed fracture of right femur [facility]) [[id number]]  Admitting Physician: Rachael Darby  Attending Physician: Durene Romans [[id number]]  Bed request comments: 5N  Certification:: I certify this patient will need inpatient services for at least 2 midnights  Estimated Length of Stay: 4          B Medical/Surgery History Past Medical History:  Diagnosis Date   Acquired hypothyroidism 03/25/2014   Acute sinusitis 03/25/2014   Chronic foot pain 03/25/2014   Chronic neck pain 11/04/2014   Cough 03/25/2014   Elevated PSA 02/04/2020   Gastro-esophageal reflux disease without esophagitis 03/25/2014   GERD (gastroesophageal reflux disease)    History of Helicobacter pylori infection 03/25/2014   Hypothyroidism    Infection due to 2019 novel coronavirus 08/06/2018   Formatting of this note might be different from the original. 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   Rhinosinusitis 11/10/2016   Seasonal allergies 12/07/2018   Seborrheic keratoses 12/07/2018   Syncope and collapse 06/04/2022   Unstable angina (HCC) 06/03/2022   Vitamin D deficiency 03/25/2014   Past Surgical History:  Procedure Laterality Date   CORONARY STENT INTERVENTION N/A 06/03/2022   Procedure: CORONARY STENT INTERVENTION;  Surgeon: Swaziland, Peter M, MD;  Location: [facility] INVASIVE CV LAB;  Service: Cardiovascular;  Laterality: N/A;   LEFT HEART CATH AND CORONARY ANGIOGRAPHY N/A 06/03/2022   Procedure: LEFT HEART CATH AND CORONARY ANGIOGRAPHY;  Surgeon: Swaziland, Peter M, MD;  Location: [facility] INVASIVE CV LAB;  Service: Cardiovascular;  Laterality: N/A;   left inguinal hernia repair Left    2024     A IV Location/Drains/Wounds Patient Lines/Drains/Airways Status     Active Line/Drains/Airways     Name Placement date Placement time Site Days   Peripheral IV 08/10/22 18 G Right Antecubital 08/10/22  1530  Antecubital  less than 1  Intake/Output Last 24 hours No intake or output data in the 24 hours ending 08/10/22 1647  Labs/Imaging Results for orders placed or performed during the hospital encounter of 08/10/22 (from the past 48 hour(s))  CBC with Differential     Status: Abnormal   Collection Time: 08/10/22  3:39 PM  Result Value Ref Range   WBC 15.4 (H) 4.0 - 10.5 K/uL   RBC 3.56 (L) 4.22 -  5.81 MIL/uL   Hemoglobin 10.6 (L) 13.0 - 17.0 g/dL   HCT 69.6 (L) 29.5 - 28.4 %   MCV 88.8 80.0 - 100.0 fL   MCH 29.8 26.0 - 34.0 pg   MCHC 33.5 30.0 - 36.0 g/dL   RDW 13.2 44.0 - 10.2 %   Platelets 192 150 - 400 K/uL   nRBC 0.0 0.0 - 0.2 %   Neutrophils Relative % 75 %   Neutro Abs 11.7 (H) 1.7 - 7.7 K/uL   Lymphocytes Relative 13 %   Lymphs Abs 2.0 0.7 - 4.0 K/uL   Monocytes Relative 8 %   Monocytes Absolute 1.2 (H) 0.1 - 1.0 K/uL   Eosinophils Relative 3 %   Eosinophils Absolute 0.5 0.0 - 0.5 K/uL   Basophils Relative 0 %   Basophils Absolute 0.1 0.0 - 0.1 K/uL   Immature Granulocytes 1 %   Abs Immature Granulocytes 0.08 (H) 0.00 - 0.07 K/uL    Comment: Performed at [facility] Lab, [address], [place], [place] [address]  Comprehensive metabolic panel     Status: Abnormal   Collection Time: 08/10/22  3:39 PM  Result Value Ref Range   Sodium 138 135 - 145 mmol/L   Potassium 3.0 (L) 3.5 - 5.1 mmol/L   Chloride 112 (H) 98 - 111 mmol/L   CO2 19 (L) 22 - 32 mmol/L   Glucose, Bld 108 (H) 70 - 99 mg/dL    Comment: Glucose reference range applies only to samples taken after fasting for at least 8 hours.   BUN 9 8 - 23 mg/dL   Creatinine, Ser 6.44 0.61 - 1.24 mg/dL   Calcium 6.6 (L) 8.9 - 10.3 mg/dL   Total Protein 4.4 (L) 6.5 - 8.1 g/dL   Albumin 2.7 (L) 3.5 - 5.0 g/dL   AST 36 15 - 41 U/L   ALT 25 0 - 44 U/L   Alkaline Phosphatase 49 38 - 126 U/L   Total Bilirubin 1.2 0.3 - 1.2 mg/dL   GFR, Estimated >03 >47 mL/min    Comment: (NOTE) Calculated using the CKD-EPI Creatinine Equation (2021)    Anion gap 7 5 - 15    Comment: Performed at [facility], [address] [place], [place] [address]  Type and screen [facility]     Status: None (Preliminary result)   Collection Time: 08/10/22  3:55 PM  Result Value Ref Range   ABO/RH(D) PENDING    Antibody Screen PENDING    Sample Expiration      08/13/2022,2359 Performed at [facility]  Lab, [address] [place], [place] [address]   I-stat chem 8, ED (not at [facility], [facility] or [facility])     Status: Abnormal   Collection Time: 08/10/22  4:14 PM  Result Value Ref Range   Sodium 140 135 - 145 mmol/L   Potassium 3.6 3.5 - 5.1 mmol/L   Chloride 107 98 - 111 mmol/L   BUN 10 8 - 23 mg/dL   Creatinine, Ser 6.43 (H) 0.61 -  1.24 mg/dL   Glucose, Bld 161 (H) 70 - 99 mg/dL    Comment: Glucose reference range applies only to samples taken after fasting for at least 8 hours.   Calcium, Ion 1.07 (L) 1.15 - 1.40 mmol/L   TCO2 22 22 - 32 mmol/L   Hemoglobin 9.9 (L) 13.0 - 17.0 g/dL   HCT 09.6 (L) 04.5 - 40.9 %   DG Femur Min 2 Views Right  Result Date: 08/10/2022 CLINICAL DATA:  Right hip pain after fall off roof. EXAM: RIGHT FEMUR 2 VIEWS COMPARISON:  None Available. FINDINGS: Severely displaced and comminuted fracture is seen involving the intertrochanteric region and proximal shaft of the right femur. IMPRESSION: Severely displaced and comminuted proximal right femoral fracture as noted above. Electronically Signed   By: Lupita Raider M.D.   On: 08/10/2022 16:19   DG Chest Portable 1 View  Result Date: 08/10/2022 CLINICAL DATA:  Fall. EXAM: PORTABLE CHEST 1 VIEW COMPARISON:  Jun 02, 2022. FINDINGS: The heart size and mediastinal contours are within normal limits. Both lungs are clear. The visualized skeletal structures are unremarkable. IMPRESSION: No active disease. Electronically Signed   By: Lupita Raider M.D.   On: 08/10/2022 16:18   DG Pelvis Portable  Result Date: 08/10/2022 CLINICAL DATA:  Right hip pain after fall off roof. EXAM: PORTABLE PELVIS 1-2 VIEWS COMPARISON:  None Available. FINDINGS: Severely displaced and comminuted fracture is seen involving the intertrochanteric region and proximal shaft of the right femur. IMPRESSION: Severely displaced and comminuted proximal right femoral fracture as noted above. Electronically Signed   By: Lupita Raider M.D.   On: 08/10/2022 16:17     Pending Labs Unresulted Labs (From admission, onward)     Start     Ordered   08/17/22 0500  Creatinine, serum  (enoxaparin (LOVENOX)    CrCl >/= 30 ml/min)  Weekly,   R     Comments: while on enoxaparin therapy    08/10/22 1600   08/10/22 1607  Surgical pcr screen  Once,   R        08/10/22 1606   08/10/22 1555  ABO/Rh  Once,   R        08/10/22 1555   08/10/22 1539  Urinalysis, Routine w reflex microscopic -Urine, Clean Catch  Once,   URGENT       Question:  Specimen Source  Answer:  Urine, Clean Catch   08/10/22 1539            Vitals/Pain Today's Vitals   08/10/22 1544 08/10/22 1552 08/10/22 1553  BP:  118/78   Pulse:  71   Resp:  16   SpO2:  100%   Weight: 68 kg  68 kg  Height: 5\' 9"  (1.753 m)  5\' 9"  (1.753 m)  PainSc: 5       Isolation Precautions No active isolations  Medications Medications  enoxaparin (LOVENOX) injection 40 mg (has no administration in time range)  methocarbamol (ROBAXIN) tablet 500 mg (has no administration in time range)    Or  methocarbamol (ROBAXIN) 500 mg in dextrose 5 % 50 mL IVPB (has no administration in time range)  ondansetron (ZOFRAN) tablet 4 mg (has no administration in time range)    Or  ondansetron (ZOFRAN) injection 4 mg (has no administration in time range)  oxyCODONE (Oxy IR/ROXICODONE) immediate release tablet 5-15 mg (has no administration in time range)  morphine (PF) 2 MG/ML injection 2 mg (2 mg Intravenous Given 08/10/22 1639)  fentaNYL (SUBLIMAZE) injection 25 mcg (25 mcg Intravenous Given by Other 08/10/22 1604)    Mobility non-ambulatory     Focused Assessments Neuro Assessment Handoff:  Swallow screen pass? Yes          Neuro Assessment:   Neuro Checks:      Has TPA been given? No If patient is a Neuro Trauma and patient is going to OR before floor call report to 4N Charge nurse: [phone] or [phone]   R Recommendations: See Admitting Provider Note  Report given to:   Additional  Notes:

## 2022-08-10 NOTE — H&P (Signed)
Gregory Gibbs is an 77 y.o. male.   Chief Complaint: Fall HPI: Gregory Gibbs was on his roof cleaning his gutters when he slipped on some wetness and fell about 10 feet. He had immediate right thigh pain and could not get up. He was brought to the ED as a level 2 trauma activation where x-rays showed a right femur fx and orthopedic surgery was consulted. He lives at home with his wife, is retired, and does not use any assistive devices to ambulate.  Past Medical History:  Diagnosis Date   Acquired hypothyroidism 03/25/2014   Acute sinusitis 03/25/2014   Chronic foot pain 03/25/2014   Chronic neck pain 11/04/2014   Cough 03/25/2014   Elevated PSA 02/04/2020   Gastro-esophageal reflux disease without esophagitis 03/25/2014   GERD (gastroesophageal reflux disease)    History of Helicobacter pylori infection 03/25/2014   Hypothyroidism    Infection due to 2019 novel coronavirus 08/06/2018   Formatting of this note might be different from the original. 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   Rhinosinusitis 11/10/2016   Seasonal allergies 12/07/2018   Seborrheic keratoses 12/07/2018   Syncope and collapse 06/04/2022   Unstable angina (HCC) 06/03/2022   Vitamin D deficiency 03/25/2014    Past Surgical History:  Procedure Laterality Date   CORONARY STENT INTERVENTION N/A 06/03/2022   Procedure: CORONARY  STENT INTERVENTION;  Surgeon: Swaziland, Peter M, MD;  Location: [facility] INVASIVE CV LAB;  Service: Cardiovascular;  Laterality: N/A;   LEFT HEART CATH AND CORONARY ANGIOGRAPHY N/A 06/03/2022   Procedure: LEFT HEART CATH AND CORONARY ANGIOGRAPHY;  Surgeon: Swaziland, Peter M, MD;  Location: [facility] INVASIVE CV LAB;  Service: Cardiovascular;  Laterality: N/A;   left inguinal hernia repair Left    2024    Family History  Problem Relation Age of Onset   Heart failure Mother    Dementia Father    Atrial fibrillation Sister    Social History:  reports that he has quit smoking. His smoking use included cigarettes. He has never used smokeless tobacco. He reports that he does not drink alcohol and does not use drugs.  Allergies: No Known Allergies  (Not in a hospital admission)   No results found for this or any previous visit (from the past 48 hour(s)). No results found.  Review of Systems  HENT:  Negative for ear discharge, ear pain, hearing loss and tinnitus.   Eyes:  Negative for photophobia and pain.  Respiratory:  Negative for cough and shortness of breath.   Cardiovascular:  Negative for chest pain.  Gastrointestinal:  Negative for abdominal pain, nausea and vomiting.  Genitourinary:  Negative for dysuria, flank pain, frequency and urgency.  Musculoskeletal:  Positive for arthralgias (Right thigh). Negative for back pain, myalgias and neck pain.  Neurological:  Negative for dizziness and headaches.  Hematological:  Does not bruise/bleed easily.  Psychiatric/Behavioral:  The patient is not nervous/anxious.     Blood pressure 118/78, pulse 71, resp. rate 16, height 5\' 9"  (1.753  m), weight 68 kg, SpO2 100%. Physical Exam Constitutional:      General: He is not in acute distress.    Appearance: He is well-developed. He is not diaphoretic.  HENT:     Head: Normocephalic and atraumatic.  Eyes:     General: No scleral icterus.       Right eye: No discharge.        Left eye: No discharge.      Conjunctiva/sclera: Conjunctivae normal.  Cardiovascular:     Rate and Rhythm: Normal rate and regular rhythm.  Pulmonary:     Effort: Pulmonary effort is normal. No respiratory distress.  Abdominal:     Palpations: Abdomen is soft.  Musculoskeletal:     Cervical back: Normal range of motion.     Comments: RLE No traumatic wounds, ecchymosis, or rash  Severe TTP thigh  No knee or ankle effusion  Knee stable to varus/ valgus and anterior/posterior stress  Sens DPN, SPN, TN intact  Motor EHL, ext, flex, evers 5/5  DP 2+, PT 2+, No significant edema  Skin:    General: Skin is warm and dry.  Neurological:     Mental Status: He is alert.  Psychiatric:        Mood and Affect: Mood normal.        Behavior: Behavior normal.      Assessment/Plan Right femur fx -- Plan IMN with Dr. Charlann Boxer this evening. Please keep NPO.  Still awaiting head and c-spine CT's    Freeman Caldron, PA-C Orthopedic Surgery [phone] 08/10/2022, 4:03 PM

## 2022-08-10 NOTE — ED Notes (Signed)
Trauma Response Nurse Documentation   Gregory Gibbs is a 77 y.o. male arriving to [facility] ED via EMS  On Brilinta (ticagrelor) 90 mg bid. Trauma was activated as a Level 2 by ED Charge RN based on the following trauma criteria Stable femur, humerus, or pelvic fracture via any mechanism except GLF.  Patient cleared for CT by Dr. Jearld Fenton. Pt transported to CT with trauma response nurse present to monitor. RN remained with the patient throughout their absence from the department for clinical observation.   GCS 15.  History   Past Medical History:  Diagnosis Date   Acquired hypothyroidism 03/25/2014   Acute sinusitis 03/25/2014   Chronic foot pain 03/25/2014   Chronic neck pain 11/04/2014   Cough 03/25/2014   Elevated PSA 02/04/2020   Gastro-esophageal reflux disease without esophagitis 03/25/2014   GERD (gastroesophageal reflux disease)    History of Helicobacter pylori infection 03/25/2014   Hypothyroidism    Infection due to 2019 novel coronavirus 08/06/2018   Formatting of this note might be different from the original. 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   Rhinosinusitis 11/10/2016   Seasonal allergies 12/07/2018   Seborrheic keratoses 12/07/2018   Syncope and collapse 06/04/2022   Unstable angina (HCC) 06/03/2022   Vitamin D deficiency 03/25/2014     Past Surgical History:   Procedure Laterality Date   CORONARY STENT INTERVENTION N/A 06/03/2022   Procedure: CORONARY STENT INTERVENTION;  Surgeon: Swaziland, Peter M, MD;  Location: [facility] INVASIVE CV LAB;  Service: Cardiovascular;  Laterality: N/A;   LEFT HEART CATH AND CORONARY ANGIOGRAPHY N/A 06/03/2022   Procedure: LEFT HEART CATH AND CORONARY ANGIOGRAPHY;  Surgeon: Swaziland, Peter M, MD;  Location: [facility] INVASIVE CV LAB;  Service: Cardiovascular;  Laterality: N/A;   left inguinal hernia repair Left    2024     Initial Focused Assessment (If applicable, or please see trauma documentation): - Airway intact - Lung sounds clear, equal bilaterally - PERRLA 1's brisk - No external bleeding  - R leg deformity with shortening  - DP pulses intact - VS WDL  - 18G PIV to R AC  CT's Completed:   CT Head, CT C-Spine, CT Chest w/ contrast, and CT abdomen/pelvis w/ contrast   Interventions:  - CXR - Pelvic XR - 25 mcg fentanyl given  - 2 mg morphine given - R femur XR - CT pan scan - trauma labs   Plan for disposition:  Admission to floor OR later today for R femur  Consults completed:  Orthopaedic Surgeon - Charma Igo @ bedside @ 1538.  Event Summary: Pt was on his roof cleaning his gutters when he slipped on something wet and fell approx 10-12 feet.  Pt landed on R hip and felt immediate pain, unable to stand up.  Pt did state that he hit his head but had no LOC.  No neck, head or back pain.  Pt is on Brilinta for previous stent placement.   Bedside handoff with ED RN  [name].    Janora Norlander  Trauma Response RN  Please call TRN at [phone] for further assistance.

## 2022-08-10 NOTE — Anesthesia Preprocedure Evaluation (Addendum)
Anesthesia Evaluation  Patient identified by MRN, date of birth, ID band Patient awake    Reviewed: Allergy & Precautions, NPO status , Patient's Chart, lab work & pertinent test results  History of Anesthesia Complications Negative for: history of anesthetic complications  Airway Mallampati: I  TM Distance: >3 FB Neck ROM: Full    Dental  (+) Edentulous Upper, Edentulous Lower, Lower Dentures, Upper Dentures   Pulmonary shortness of breath and with exertion, former smoker   breath sounds clear to auscultation       Cardiovascular (-) angina + CAD and + Cardiac Stents (06/03/2022)   Rhythm:Regular Rate:Normal  06/2022 ECHO:  1. EF 45-50%. The LV has mildly decreased function, global hypokinesis. Grade I diastolic dysfunction (impaired relaxation).   2. RVF is normal. The right ventricular size is normal.   3. The mitral valve is normal in structure. Mild mitral valve regurgitation.   4. The aortic valve is tricuspid. Aortic valve regurgitation is not visualized.     Neuro/Psych negative neurological ROS     GI/Hepatic Neg liver ROS,GERD  Medicated and Controlled,,  Endo/Other  Hypothyroidism    Renal/GU Renal InsufficiencyRenal disease     Musculoskeletal   Abdominal   Peds  Hematology  (+) Blood dyscrasia (HB 9.9, plt 192k), anemia Brilinta: took today   Anesthesia Other Findings   Reproductive/Obstetrics                             Anesthesia Physical Anesthesia Plan  ASA: 4 and emergent  Anesthesia Plan: General   Post-op Pain Management: Tylenol PO (pre-op)*   Induction: Intravenous  PONV Risk Score and Plan: 2 and Ondansetron and Dexamethasone  Airway Management Planned: Oral ETT  Additional Equipment: None  Intra-op Plan:   Post-operative Plan: Extubation in OR  Informed Consent: I have reviewed the patients History and Physical, chart, labs and discussed the procedure  including the risks, benefits and alternatives for the proposed anesthesia with the patient or authorized representative who has indicated his/her understanding and acceptance.       Plan Discussed with: CRNA and Surgeon  Anesthesia Plan Comments: (Given Brilinta and new LAD stent, surgeon will change to ex fix/traction tonight, and have cards eval in am to guide periop anticoag)       Anesthesia Quick Evaluation

## 2022-08-10 NOTE — Progress Notes (Signed)
Orthopedic Tech Progress Note Patient Details:  [name] [birth year] [id number] Level 2 Trauma  Patient ID: Gregory Gibbs, male   DOB: May 01, 1945, 77 y.o.   MRN: [id number]  Gregory Gibbs 08/10/2022, 4:51 PM

## 2022-08-10 NOTE — Progress Notes (Signed)
Waiting for a CT scanner.  Will head over in approx 10 minutes.

## 2022-08-10 NOTE — ED Triage Notes (Signed)
Pt to the ed from home with a CC of fall off roof while cleaning gutters. Pt fell about 10 feet onto his rights hip. Pt relays he did hit his head but did not have LOC. Pt is on blood thinners. A&Ox 4

## 2022-08-10 NOTE — TOC CAGE-AID Note (Signed)
Transition of Care [facility]) - CAGE-AID Screening   Patient Details  Name: Gregory Gibbs MRN: [id number] Date of Birth: 01-20-1945  Transition of Care [facility]) CM/SW Contact:    Janora Norlander, RN Phone Number: [phone] 08/10/2022, 7:14 PM   Clinical Narrative: Pt here after sustaining a R proximal femur fracture due to falling off of his roof approx 10-12 ft. Pt denies alcohol or drug use. Screening complete.   CAGE-AID Screening:    Have You Ever Felt You Ought to Cut Down on Your Drinking or Drug Use?: No Have People Annoyed You By Critizing Your Drinking Or Drug Use?: No Have You Felt Bad Or Guilty About Your Drinking Or Drug Use?: No Have You Ever Had a Drink or Used Drugs First Thing In The Morning to Steady Your Nerves or to Get Rid of a Hangover?: No CAGE-AID Score: 0  Substance Abuse Education Offered: No

## 2022-08-10 NOTE — Progress Notes (Signed)
Pt arrived to 6 north room 21 alert and oriented x4. Pain level 7/10. Pt refusing to be moved or adjusted in bed at this time. Will continue to monitor pt.

## 2022-08-10 NOTE — Interval H&P Note (Signed)
History and Physical Interval Note:  08/10/2022 11:50 PM  Gregory Gibbs  has presented today for surgery, with the diagnosis of Femur Fracture.  The various methods of treatment have been discussed with the patient and family. After consideration of risks, benefits and other options for treatment, the patient has consented to  Procedure(s): RIGH INTRAMEDULLARY (IM) NAIL INTERTROCHANTERIC (Right) as a surgical intervention.  The patient's history has been reviewed, patient examined, no change in status, stable for surgery.  I have reviewed the patient's chart and labs.  Questions were answered to the patient's satisfaction.     Shelda Pal

## 2022-08-11 ENCOUNTER — Encounter (HOSPITAL_COMMUNITY): Payer: Self-pay | Admitting: Orthopedic Surgery

## 2022-08-11 DIAGNOSIS — S7291XA Unspecified fracture of right femur, initial encounter for closed fracture: Secondary | ICD-10-CM | POA: Diagnosis not present

## 2022-08-11 DIAGNOSIS — D72829 Elevated white blood cell count, unspecified: Secondary | ICD-10-CM | POA: Diagnosis not present

## 2022-08-11 DIAGNOSIS — I5022 Chronic systolic (congestive) heart failure: Secondary | ICD-10-CM

## 2022-08-11 DIAGNOSIS — Z01810 Encounter for preprocedural cardiovascular examination: Secondary | ICD-10-CM | POA: Diagnosis not present

## 2022-08-11 DIAGNOSIS — D649 Anemia, unspecified: Secondary | ICD-10-CM | POA: Diagnosis present

## 2022-08-11 DIAGNOSIS — S72141A Displaced intertrochanteric fracture of right femur, initial encounter for closed fracture: Secondary | ICD-10-CM | POA: Diagnosis not present

## 2022-08-11 DIAGNOSIS — E039 Hypothyroidism, unspecified: Secondary | ICD-10-CM

## 2022-08-11 DIAGNOSIS — I2583 Coronary atherosclerosis due to lipid rich plaque: Secondary | ICD-10-CM

## 2022-08-11 DIAGNOSIS — I2510 Atherosclerotic heart disease of native coronary artery without angina pectoris: Secondary | ICD-10-CM

## 2022-08-11 DIAGNOSIS — E785 Hyperlipidemia, unspecified: Secondary | ICD-10-CM

## 2022-08-11 LAB — BASIC METABOLIC PANEL
Anion gap: 8 (ref 5–15)
BUN: 12 mg/dL (ref 8–23)
CO2: 21 mmol/L — ABNORMAL LOW (ref 22–32)
Calcium: 8.1 mg/dL — ABNORMAL LOW (ref 8.9–10.3)
Chloride: 109 mmol/L (ref 98–111)
Creatinine, Ser: 1.38 mg/dL — ABNORMAL HIGH (ref 0.61–1.24)
GFR, Estimated: 53 mL/min — ABNORMAL LOW (ref >60)
Glucose, Bld: 129 mg/dL — ABNORMAL HIGH (ref 70–99)
Potassium: 4.5 mmol/L (ref 3.5–5.1)
Sodium: 138 mmol/L (ref 135–145)

## 2022-08-11 LAB — CBC
HCT: 29 % — ABNORMAL LOW (ref 39.0–52.0)
Hemoglobin: 9.7 g/dL — ABNORMAL LOW (ref 13.0–17.0)
MCH: 29.2 pg (ref 26.0–34.0)
MCHC: 33.4 g/dL (ref 30.0–36.0)
MCV: 87.3 fL (ref 80.0–100.0)
Platelets: 161 10*3/uL (ref 150–400)
RBC: 3.32 MIL/uL — ABNORMAL LOW (ref 4.22–5.81)
RDW: 12.2 % (ref 11.5–15.5)
WBC: 14 10*3/uL — ABNORMAL HIGH (ref 4.0–10.5)
nRBC: 0 % (ref 0.0–0.2)

## 2022-08-11 MED ORDER — FENTANYL CITRATE (PF) 250 MCG/5ML IJ SOLN
INTRAMUSCULAR | Status: DC | PRN
  Administered 2022-08-10 – 2022-08-11 (×3): 50 ug via INTRAVENOUS

## 2022-08-11 MED ORDER — LEVOTHYROXINE SODIUM 50 MCG PO TABS
50.0000 ug | ORAL_TABLET | Freq: Every morning | ORAL | Status: DC
  Administered 2022-08-11 – 2022-08-18 (×7): 50 ug via ORAL
  Filled 2022-08-11 (×7): qty 1

## 2022-08-11 MED ORDER — DEXAMETHASONE SODIUM PHOSPHATE 10 MG/ML IJ SOLN
INTRAMUSCULAR | Status: DC | PRN
  Administered 2022-08-11: 5 mg via INTRAVENOUS

## 2022-08-11 MED ORDER — FENTANYL CITRATE (PF) 100 MCG/2ML IJ SOLN: 25.0000 ug | INTRAMUSCULAR | Status: DC | PRN

## 2022-08-11 MED ORDER — PHENYLEPHRINE 80 MCG/ML (10ML) SYRINGE FOR IV PUSH (FOR BLOOD PRESSURE SUPPORT)
PREFILLED_SYRINGE | INTRAVENOUS | Status: DC | PRN
  Administered 2022-08-11: 160 ug via INTRAVENOUS

## 2022-08-11 MED ORDER — ONDANSETRON HCL 4 MG/2ML IJ SOLN
INTRAMUSCULAR | Status: DC | PRN
Start: 2022-08-11 — End: 2022-08-11
  Administered 2022-08-11: 4 mg via INTRAVENOUS

## 2022-08-11 MED ORDER — METHOCARBAMOL 1000 MG/10ML IJ SOLN: 500.0000 mg | Freq: Four times a day (QID) | INTRAVENOUS | Status: DC | PRN

## 2022-08-11 MED ORDER — ASPIRIN 81 MG PO CHEW
81.0000 mg | CHEWABLE_TABLET | Freq: Every day | ORAL | Status: DC
  Administered 2022-08-11 – 2022-08-18 (×8): 81 mg via ORAL
  Filled 2022-08-11 (×8): qty 1

## 2022-08-11 MED ORDER — ATORVASTATIN CALCIUM 80 MG PO TABS
80.0000 mg | ORAL_TABLET | Freq: Every day | ORAL | Status: DC
  Administered 2022-08-11 – 2022-08-18 (×8): 80 mg via ORAL
  Filled 2022-08-11 (×8): qty 1

## 2022-08-11 MED ORDER — HYDROMORPHONE HCL 1 MG/ML IJ SOLN
0.5000 mg | INTRAMUSCULAR | Status: DC | PRN
  Administered 2022-08-11 – 2022-08-14 (×5): 1 mg via INTRAVENOUS
  Filled 2022-08-11 (×6): qty 1

## 2022-08-11 MED ORDER — ETOMIDATE 2 MG/ML IV SOLN
INTRAVENOUS | Status: DC | PRN
Start: 2022-08-10 — End: 2022-08-11
  Administered 2022-08-10: 8 mg via INTRAVENOUS

## 2022-08-11 MED ORDER — PANTOPRAZOLE SODIUM 40 MG PO TBEC
40.0000 mg | DELAYED_RELEASE_TABLET | Freq: Every day | ORAL | Status: DC
  Administered 2022-08-12 – 2022-08-18 (×7): 40 mg via ORAL
  Filled 2022-08-11 (×7): qty 1

## 2022-08-11 MED ORDER — LIDOCAINE 2% (20 MG/ML) 5 ML SYRINGE
INTRAMUSCULAR | Status: DC | PRN
  Administered 2022-08-10: 20 mg via INTRAVENOUS

## 2022-08-11 MED ORDER — ONDANSETRON HCL 4 MG/2ML IJ SOLN: 4.0000 mg | Freq: Four times a day (QID) | INTRAMUSCULAR | Status: DC | PRN

## 2022-08-11 MED ORDER — METOCLOPRAMIDE HCL 5 MG PO TABS
5.0000 mg | ORAL_TABLET | Freq: Three times a day (TID) | ORAL | Status: DC | PRN
  Administered 2022-08-17: 10 mg via ORAL
  Filled 2022-08-11: qty 2

## 2022-08-11 MED ORDER — OXYCODONE HCL 5 MG PO TABS: 5.0000 mg | ORAL_TABLET | Freq: Once | ORAL | Status: DC | PRN

## 2022-08-11 MED ORDER — CALCIUM GLUCONATE-NACL 2-0.675 GM/100ML-% IV SOLN
2.0000 g | Freq: Once | INTRAVENOUS | Status: DC
Start: 2022-08-11 — End: 2022-08-11

## 2022-08-11 MED ORDER — CALCIUM GLUCONATE-NACL 1-0.675 GM/50ML-% IV SOLN
1.0000 g | Freq: Once | INTRAVENOUS | Status: AC
  Administered 2022-08-11: 1000 mg via INTRAVENOUS
  Filled 2022-08-11: qty 50

## 2022-08-11 MED ORDER — OXYCODONE HCL 5 MG PO TABS: 10.0000 mg | ORAL_TABLET | ORAL | Status: DC | PRN

## 2022-08-11 MED ORDER — HYDROMORPHONE HCL 1 MG/ML IJ SOLN: 0.2500 mg | INTRAMUSCULAR | Status: DC | PRN

## 2022-08-11 MED ORDER — ACETAMINOPHEN 325 MG PO TABS: 325.0000 mg | ORAL_TABLET | Freq: Four times a day (QID) | ORAL | Status: DC | PRN

## 2022-08-11 MED ORDER — SUGAMMADEX SODIUM 200 MG/2ML IV SOLN
INTRAVENOUS | Status: DC | PRN
  Administered 2022-08-11: 275 mg via INTRAVENOUS

## 2022-08-11 MED ORDER — ENOXAPARIN SODIUM 40 MG/0.4ML IJ SOSY
40.0000 mg | PREFILLED_SYRINGE | INTRAMUSCULAR | Status: DC
  Administered 2022-08-11: 40 mg via SUBCUTANEOUS
  Filled 2022-08-11: qty 0.4

## 2022-08-11 MED ORDER — MIDAZOLAM HCL 2 MG/2ML IJ SOLN: 0.5000 mg | Freq: Once | INTRAMUSCULAR | Status: DC | PRN

## 2022-08-11 MED ORDER — POTASSIUM CHLORIDE IN NACL 20-0.9 MEQ/L-% IV SOLN
INTRAVENOUS | Status: DC
  Filled 2022-08-11 (×5): qty 1000

## 2022-08-11 MED ORDER — LACTATED RINGERS IV SOLN: INTRAVENOUS | Status: DC | PRN

## 2022-08-11 MED ORDER — ROCURONIUM BROMIDE 10 MG/ML (PF) SYRINGE
PREFILLED_SYRINGE | INTRAVENOUS | Status: DC | PRN
  Administered 2022-08-10: 50 mg via INTRAVENOUS

## 2022-08-11 MED ORDER — OXYCODONE HCL 5 MG/5ML PO SOLN: 5.0000 mg | Freq: Once | ORAL | Status: DC | PRN

## 2022-08-11 MED ORDER — METHOCARBAMOL 500 MG PO TABS
500.0000 mg | ORAL_TABLET | Freq: Four times a day (QID) | ORAL | Status: DC | PRN
  Administered 2022-08-13: 500 mg via ORAL
  Filled 2022-08-11: qty 1

## 2022-08-11 MED ORDER — ENOXAPARIN SODIUM 40 MG/0.4ML IJ SOSY: 40.0000 mg | PREFILLED_SYRINGE | INTRAMUSCULAR | Status: DC

## 2022-08-11 MED ORDER — METOCLOPRAMIDE HCL 5 MG/ML IJ SOLN: 5.0000 mg | Freq: Three times a day (TID) | INTRAMUSCULAR | Status: DC | PRN

## 2022-08-11 MED ORDER — FERROUS SULFATE 325 (65 FE) MG PO TABS
325.0000 mg | ORAL_TABLET | Freq: Three times a day (TID) | ORAL | Status: DC
  Administered 2022-08-11 – 2022-08-18 (×19): 325 mg via ORAL
  Filled 2022-08-11 (×20): qty 1

## 2022-08-11 MED ORDER — OXYCODONE HCL 5 MG PO TABS: 5.0000 mg | ORAL_TABLET | ORAL | Status: DC | PRN

## 2022-08-11 MED ORDER — DOCUSATE SODIUM 100 MG PO CAPS
100.0000 mg | ORAL_CAPSULE | Freq: Two times a day (BID) | ORAL | Status: DC
  Administered 2022-08-11 – 2022-08-18 (×13): 100 mg via ORAL
  Filled 2022-08-11 (×15): qty 1

## 2022-08-11 MED ORDER — PROPOFOL 10 MG/ML IV BOLUS
INTRAVENOUS | Status: DC | PRN
  Administered 2022-08-10: 40 mg via INTRAVENOUS

## 2022-08-11 MED ORDER — PROMETHAZINE HCL 25 MG/ML IJ SOLN: 6.2500 mg | INTRAMUSCULAR | Status: DC | PRN

## 2022-08-11 MED ORDER — MEPERIDINE HCL 25 MG/ML IJ SOLN: 6.2500 mg | INTRAMUSCULAR | Status: DC | PRN

## 2022-08-11 MED ORDER — ACETAMINOPHEN 500 MG PO TABS
1000.0000 mg | ORAL_TABLET | Freq: Four times a day (QID) | ORAL | Status: AC
  Administered 2022-08-11 (×4): 1000 mg via ORAL
  Filled 2022-08-11 (×4): qty 2

## 2022-08-11 MED ORDER — ONDANSETRON HCL 4 MG PO TABS
4.0000 mg | ORAL_TABLET | Freq: Four times a day (QID) | ORAL | Status: DC | PRN
  Administered 2022-08-16 – 2022-08-18 (×4): 4 mg via ORAL
  Filled 2022-08-11 (×5): qty 1

## 2022-08-11 MED ORDER — ASPIRIN 81 MG PO CHEW
81.0000 mg | CHEWABLE_TABLET | Freq: Once | ORAL | Status: DC
Start: 2022-08-11 — End: 2022-08-11

## 2022-08-11 NOTE — Plan of Care (Signed)

## 2022-08-11 NOTE — Progress Notes (Signed)
Patient ID: Gregory Gibbs, male   DOB: Jun 16, 1945, 77 y.o.   MRN: [id number]   On the evening of 08/10/2022 when we were able to get him to the operating room around 10 30-11 o'clock I had a lengthy discussion regarding the nature of his injury and the extent of the operative repair with the anesthesiologist on-call Dr. Jairo Ben.  Concerns were raised regarding his recent cardiac stent placed in his LAD at the end of May and then initiation of Brilinta.  Interval concerns regarding excessive bleeding and the potential need for blood products given what I anticipated to require an open reduction with placement of cables. We determined based on this discussion that it would be in his best interest to have a cardiology consult to make certain that appropriate anticoagulation measures were taken perioperatively as well as and importantly have the procedure performed during normal hours with the orthopedic trauma team. For that reason I discussed with the patient and his family plan of setting up skeletal traction to help stabilize the fracture until definitive surgery can be performed.  We discussed the minimal risks and but stressed the benefits of skeletal traction.  So rather than proceeding with open reduction internal fixation and putting him at risk we elected to place a traction pin and set him up with skeletal traction this evening. I will discuss this case with Dr. Caryn Bee Haddix on 08/11/2002 to try to come up with a definitive plan for him.  Again, cardiology will be consulted as well.

## 2022-08-11 NOTE — Consult Note (Signed)
Initial Consultation Note   Patient: Gregory Gibbs IEP:[id number] DOB: 07-11-1945 PCP: Hadley Pen, MD DOA: 08/10/2022 DOS: the patient was seen and examined on 08/11/2022 Primary service: Durene Romans, MD  Referring physician: Durene Romans, MD Reason for consult: Medical Management  Assessment/Plan: Assessment and Plan:   Close displaced right intertrochanteric femur fracture femur fracture secondary to fall Patient reports having a mechanical fall on a slick tin roof yesterday while trying to clean out the gutters.  He landed on his right hip and was found to have a closed displaced right femur fracture.  Orthopedics planning on taking for surgical correction in AM. -Per orthopedics  Leukocytosis Acute.  WBC trending down 15.4-> 14 today.  Thought secondary to the marginalization due to acute fracture. -Continue to monitor  Normocytic anemia Acute.  Hemoglobin 10.6->9.7.  Records note hemoglobin had been around 13 previously at the beginning of June.  Patient denied any blood in stools or urine. -Recheck CBC tomorrow morning  CAD Patient just recently underwent cardiac catheterization 5/31 found to have two-vessel CAD with culprit lesion in the proximal LAD and 75% segmental stenosis of the first OM.  Patient had successful PCI to the proximal LAD with drug-eluting stent.  He has been on aspirin, Plavix, and Brilinta which she last took yesterday. -Hold Brilinta -Continue aspirin and atorvastatin  Heart failure with reduced EF Last echocardiogram noted EF to be 45%.  Patient does not appear fluid overloaded on physical exam. -Continue to monitor  Acquired hypothyroidism -Continue levothyroxine  Hyperlipidemia -Continue atorvastatin  TRH will continue to follow the patient.  HPI: Gregory Gibbs is a 77 y.o. male with past medical history of CAD, hypothyroidism, and GERD who presented after falling off a tin roof yesterday while cleaning gutters with right thigh pain.   He thought the roof was dry, but he lost his footing on a slick spot causing him to fall.  He did hit his head, but denied any loss of consciousness.  Found to have a communicated right proximal femur fracture. Patient had just recently been hospitalized 5/31-6/2 for unstable angina and underwent cardiac catheterization noting two-vessel CAD with culprit lesion in the proximal large LAD with 75% segmental stenosis in the first OM.  He had undergone placement of a drug-eluting stent to the proximal LAD and has been on dual antiplatelet therapy.  He does report having some shortness of breath since being on Brilinta thought to be one of the side effects.  Patient had initially been admitted into the orthopedic service yesterday and underwent closed reduction of the femur fracture with placement of a lower extremity traction pin.  Given recent stent placement TRH consulted for medical management.  Review of Systems: As mentioned in the history of present illness. All other systems reviewed and are negative. Past Medical History:  Diagnosis Date   Acquired hypothyroidism 03/25/2014   Acute sinusitis 03/25/2014   Chronic foot pain 03/25/2014   Chronic neck pain 11/04/2014   Cough 03/25/2014   Elevated PSA 02/04/2020   Gastro-esophageal reflux disease without esophagitis 03/25/2014   GERD (gastroesophageal reflux disease)    History of Helicobacter pylori infection 03/25/2014   Hypothyroidism    Infection due to 2019 novel coronavirus 08/06/2018   Formatting of this note might be different from the original. 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   Rhinosinusitis 11/10/2016   Seasonal allergies 12/07/2018   Seborrheic keratoses 12/07/2018   Syncope and collapse 06/04/2022   Unstable angina (HCC) 06/03/2022   Vitamin D deficiency 03/25/2014   Past Surgical History:  Procedure Laterality Date   CORONARY STENT INTERVENTION N/A 06/03/2022   Procedure: CORONARY STENT INTERVENTION;  Surgeon: [place], Peter M, MD;  Location: [facility] INVASIVE CV LAB;  Service: Cardiovascular;  Laterality: N/A;   INTRAMEDULLARY (IM) NAIL INTERTROCHANTERIC Right 08/10/2022   Procedure: RIGHT PLACEMENT OF RIGHT LOWER EXTREMITY TRACTION PIN;  Surgeon: Durene Romans, MD;  Location: [facility] OR;  Service: Orthopedics;  Laterality: Right;   LEFT HEART CATH AND CORONARY ANGIOGRAPHY N/A 06/03/2022   Procedure: LEFT HEART CATH AND CORONARY ANGIOGRAPHY;  Surgeon: [place], Peter M, MD;  Location: [facility] INVASIVE CV LAB;  Service: Cardiovascular;  Laterality: N/A;   left inguinal hernia repair Left    2024   Social History:  reports that he has quit smoking. His smoking use included cigarettes. He has never used smokeless tobacco. He reports that he does not drink alcohol and does not use drugs.  No Known Allergies  Family History  Problem Relation Age of Onset   Heart failure Mother    Dementia Father    Atrial fibrillation Sister     Prior to Admission medications   Medication Sig Start Date End Date Taking? Authorizing Provider  aspirin EC 81 MG tablet Take 1 tablet (81 mg total) by mouth daily. Swallow whole. 06/06/22  Yes Strader, [place] M, PA-C  atorvastatin (LIPITOR) 80 MG tablet Take 1 tablet (80 mg total) by mouth daily. 06/06/22  Yes Strader, Lennart Pall, PA-C  Cyanocobalamin 1000 MCG TBCR Take 1 tablet by mouth daily. 08/26/09  Yes  [provider]  levothyroxine (SYNTHROID) 50 MCG tablet Take 50 mcg by mouth every morning.   Yes [provider]  Multiple Vitamin (MULTIVITAMIN WITH MINERALS) TABS tablet Take 1 tablet by mouth daily.   Yes [provider]  pantoprazole (PROTONIX) 40 MG tablet Take 40 mg by mouth daily.   Yes [provider]  ticagrelor (BRILINTA) 90 MG TABS tablet Take 1 tablet (90 mg total) by mouth 2 (two) times daily. 06/05/22  Yes Strader, [place] M, PA-C  nitroGLYCERIN (NITROSTAT) 0.4 MG SL tablet Place 1 tablet (0.4 mg total) under the tongue every 5 (five) minutes x 3 doses as needed for chest pain. Patient not taking: Reported on 08/10/2022 06/05/22   [name], PA-C    Physical Exam: Vitals:   08/11/22 0045 08/11/22 0052 08/11/22 0108 08/11/22 0432  BP: (!) 102/57 120/66 117/70 107/60  Pulse: 74 72 74 72  Resp: 18 20    Temp:  98.9 F (37.2 C) 98.7 F (37.1 C) 98 F (36.7 C)  TempSrc:   Oral   SpO2: 94% 94% 98% 97%  Weight:      Height:       Constitutional: Elderly male in NAD, calm, comfortable Eyes: PERRL, lids and conjunctivae normal ENMT: Mucous membranes are moist. Posterior pharynx clear of any exudate or lesions.  Neck: normal, supple, no JVd Respiratory: clear to auscultation bilaterally, no wheezing, no  crackles. Normal respiratory effort. No accessory muscle use.  Cardiovascular: Regular rate and rhythm, no murmurs / rubs / gallops.   Abdomen: no tenderness, no masses palpated. Bowel sounds positive.  Musculoskeletal: no clubbing / cyanosis. Right leg in traction Skin: no rashes, lesions, ulcers. No induration Neurologic: CN 2-12 grossly intact.  Strength 5/5 in all 4.  Psychiatric: Normal judgment and insight. Alert and oriented x 3. Normal mood.   Data Reviewed:   reviewed labs, imaging, and pertinent records as noted above   Family Communication: Wife updated at bedside. Primary team communication:  Thank you very much for  involving [place] in the care of your patient.  Author: Clydie Braun, MD 08/11/2022 9:20 AM  For on call review [email or web address].

## 2022-08-11 NOTE — Consult Note (Addendum)
Cardiology Consultation   Patient ID: Gregory Gibbs MRN: [id number]; DOB: 06-Nov-1945  Admit date: 08/10/2022 Date of Consult: 08/11/2022  PCP:  Hadley Pen, MD   [facility] Providers Cardiologist:  Gypsy Balsam, MD        Patient Profile:   Gregory Gibbs is a 77 y.o. male with a hx of recently diagnosed CAD, ICM/HFmrEF, HLD, syncope with positive orthostatic hypotension, CKD stage III and hypothyroidism who is being seen 08/11/2022 for management of DAPT while waiting on hip surgery at the request of Dr. Charlann Boxer.  History of Present Illness:   Gregory Gibbs is a 77 yo male with recently diagnosed CAD, ICM/HFmrEF, HLD, syncope with positive orthostatic hypotension, CKD stage III and hypothyroidism.  Patient presented to [facility] near the end of May 2024 for complaining of a stinging sensation in the substernal area every time he overexerted himself.  Creatinine was 1.2 at the time.  Subsequent treadmill exercise stress test obtained on 06/03/2022 showed possible EKG changes with ST depression and downsloping in the inferolateral leads, moderate size regional reversible ischemia in the anterior septal wall, EF 45%, overall high risk study.  Patient was transferred to [facility] for cardiac catheterization which was performed on 06/03/2022 that revealed 90% proximal LAD lesion, 45% D1 lesion, 75% OM1 lesion.  EF 55 to 65% by LV gram.  Proximal LAD lesion was treated with Synergy XD 3.0 x 12 mm DES.  OM lesion was managed medically.  Postprocedure, patient was placed on aspirin and Brilinta.  He was initially on beta-blocker, however had a syncope during the hospitalization after he got up to walk to the bathroom to urinate.  It was reported his heart rate dropped down to the 30s during the syncope.  Orthostatic vital sign was also positive even on the following day with systolic blood pressure dropping from 130 down to 100.  Beta-blocker was discontinued at that time.  He was  ultimately discharged on aspirin, Lipitor, Brilinta and levothyroxine.  Since discharge, he has been doing well.  He does have periodic acute onset of shortness of breath consistent with side effect from Brilinta, however he preferred to stay on the Brilinta as it is a superior medication compared to the generic Plavix.  He was in his usual state of health on 08/10/2022 around 1:50 PM when he went up to the roof to clean the gutter.  He missed stepped and fell on his right hip.  He was subsequently transferred to [facility] emergency room.  He was unable to get up after the fall.  X-ray showed right femur fracture.  Orthopedic service was consulted.  Patient was taken to the OR on the night of 08/10/2022 and underwent placement of right lower extremity traction pin.  Dr. Charlann Boxer is currently in the process of discussing his case was Ortho trauma service to decide the best course of option.  Since his admission, his aspirin to Brilinta has been held.  The last time he took his dual antiplatelet drug was around 8 AM in the morning of 08/10/2022.  He denies any obvious chest discomfort.  Cardiology service was consulted to help manage dual antiplatelet therapy during perioperative period.    Past Medical History:  Diagnosis Date   Acquired hypothyroidism 03/25/2014   Acute sinusitis 03/25/2014   Chronic foot pain 03/25/2014   Chronic neck pain 11/04/2014   Cough 03/25/2014   Elevated PSA 02/04/2020   Gastro-esophageal reflux disease without esophagitis 03/25/2014   GERD (gastroesophageal  reflux disease)    History of Helicobacter pylori infection 03/25/2014   Hypothyroidism    Infection due to 2019 novel coronavirus 08/06/2018   Formatting of this note might be different from the original. 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   Rhinosinusitis 11/10/2016   Seasonal allergies 12/07/2018   Seborrheic keratoses 12/07/2018   Syncope and collapse 06/04/2022   Unstable angina (HCC) 06/03/2022   Vitamin D deficiency 03/25/2014    Past Surgical History:  Procedure Laterality Date   CORONARY STENT INTERVENTION N/A 06/03/2022   Procedure: CORONARY STENT INTERVENTION;  Surgeon: Swaziland, Peter M, MD;  Location: [facility] INVASIVE CV LAB;  Service: Cardiovascular;  Laterality: N/A;   INTRAMEDULLARY (IM) NAIL INTERTROCHANTERIC Right 08/10/2022   Procedure: RIGHT PLACEMENT OF RIGHT LOWER EXTREMITY TRACTION PIN;  Surgeon: Durene Romans, MD;  Location: [facility] OR;  Service: Orthopedics;  Laterality: Right;   LEFT HEART CATH AND CORONARY ANGIOGRAPHY N/A 06/03/2022   Procedure: LEFT HEART CATH AND CORONARY ANGIOGRAPHY;  Surgeon: Swaziland, Peter M, MD;  Location: [facility] INVASIVE CV LAB;  Service: Cardiovascular;  Laterality: N/A;   left inguinal hernia repair Left    2024     Home Medications:  Prior to Admission medications   Medication Sig Start Date End Date Taking? Authorizing Provider  aspirin EC 81 MG tablet Take 1 tablet (81 mg total) by mouth daily. Swallow whole. 06/06/22  Yes Strader, [place] M, PA-C  atorvastatin (LIPITOR) 80 MG tablet Take 1 tablet (80 mg total) by mouth daily. 06/06/22  Yes Strader, Lennart Pall, PA-C  Cyanocobalamin 1000 MCG TBCR Take 1 tablet by mouth daily. 08/26/09  Yes [provider]  levothyroxine (SYNTHROID) 50 MCG tablet Take 50 mcg by mouth every morning.   Yes [provider]  Multiple  Vitamin (MULTIVITAMIN WITH MINERALS) TABS tablet Take 1 tablet by mouth daily.   Yes [provider]  pantoprazole (PROTONIX) 40 MG tablet Take 40 mg by mouth daily.   Yes [provider]  ticagrelor (BRILINTA) 90 MG TABS tablet Take 1 tablet (90 mg total) by mouth 2 (two) times daily. 06/05/22  Yes Strader, [place] M, PA-C  nitroGLYCERIN (NITROSTAT) 0.4 MG SL tablet Place 1 tablet (0.4 mg total) under the tongue every 5 (five) minutes x 3 doses as needed for chest pain. Patient not taking: Reported on 08/10/2022 06/05/22   [name], PA-C    Inpatient Medications: Scheduled Meds:  acetaminophen  1,000 mg Oral Q6H   docusate sodium  100 mg Oral BID   enoxaparin (LOVENOX) injection  40 mg Subcutaneous Q24H   ferrous sulfate  325 mg Oral TID PC   Continuous Infusions:  0.9 % NaCl with KCl 20 mEq / L 100 mL/hr at 08/11/22 0257   methocarbamol (ROBAXIN) IV     PRN Meds: [START ON 08/12/2022] acetaminophen, HYDROmorphone (DILAUDID) injection, methocarbamol **OR** methocarbamol (ROBAXIN) IV, metoCLOPramide **OR** metoCLOPramide (REGLAN) injection, morphine injection, ondansetron **OR** ondansetron (ZOFRAN) IV, oxyCODONE, oxyCODONE, oxyCODONE  Allergies:   No  Known Allergies  Social History:   Social History   Socioeconomic History   Marital status: Married    Spouse name: Not on file   Number of children: Not on file   Years of education: Not on file   Highest education level: Not on file  Occupational History   Not on file  Tobacco Use   Smoking status: Former    Types: Cigarettes   Smokeless tobacco: Never   Tobacco comments:    Quit in 1977, smoked a total of 12 years  Substance and Sexual Activity   Alcohol use: Never   Drug use: Never   Sexual activity: Not on file  Other Topics Concern   Not on file  Social History Narrative   Not on file   Social Determinants of Health   Financial Resource Strain: Not on file  Food Insecurity: Not on file   Transportation Needs: Not on file  Physical Activity: Not on file  Stress: Not on file  Social Connections: Not on file  Intimate Partner Violence: Not on file    Family History:    Family History  Problem Relation Age of Onset   Heart failure Mother    Dementia Father    Atrial fibrillation Sister      ROS:  Please see the history of present illness.   All other ROS reviewed and negative.     Physical Exam/Data:   Vitals:   08/11/22 0052 08/11/22 0108 08/11/22 0432 08/11/22 0938  BP: 120/66 117/70 107/60 113/69  Pulse: 72 74 72 81  Resp: 20   17  Temp: 98.9 F (37.2 C) 98.7 F (37.1 C) 98 F (36.7 C) 98 F (36.7 C)  TempSrc:  Oral  Oral  SpO2: 94% 98% 97% 99%  Weight:      Height:        Intake/Output Summary (Last 24 hours) at 08/11/2022 1019 Last data filed at 08/11/2022 1006 Gross per 24 hour  Intake 1132.87 ml  Output 300 ml  Net 832.87 ml      08/10/2022    3:53 PM 08/10/2022    3:44 PM 06/29/2022    2:49 PM  Last 3 Weights  Weight (lbs) 149 lb 14.6 oz 149 lb 14.6 oz 152 lb  Weight (kg) 68 kg 68 kg 68.947 kg     Body mass index is 22.14 kg/m.  General:  Well nourished, well developed, in no acute distress HEENT: normal Neck: no JVD Vascular: No carotid bruits; Distal pulses 2+ bilaterally Cardiac:  normal S1, S2; RRR; no murmur  Lungs:  clear to auscultation bilaterally, no wheezing, rhonchi or rales  Abd: soft, nontender, no hepatomegaly  Ext: no edema Musculoskeletal:  No deformities, BUE and BLE strength normal and equal Skin: warm and dry  Neuro:  CNs 2-12 intact, no focal abnormalities noted Psych:  Normal affect   EKG:  The EKG was personally reviewed and demonstrates:  Pending Telemetry:  Telemetry was personally reviewed and demonstrates:  N/A  Relevant CV Studies:    Laboratory Data:  High Sensitivity Troponin:  No results for input(s): "TROPONINIHS" in the last 720 hours.   Chemistry Recent Labs  Lab 08/10/22 1539  08/10/22 1614 08/10/22 2338 08/11/22 0323  NA 138 140 139 138  K 3.0* 3.6 4.4 4.5  CL 112* 107 105 109  CO2 19*  --   --  21*  GLUCOSE 108* 119* 117* 129*  BUN 9 10 13 12   CREATININE 1.14 1.30* 1.20 1.38*  CALCIUM 6.6*  --   --  8.1*  GFRNONAA >60  --   --  53*  ANIONGAP 7  --   --  8    Recent Labs  Lab 08/10/22 1539  PROT 4.4*  ALBUMIN 2.7*  AST 36  ALT 25  ALKPHOS 49  BILITOT 1.2   Lipids No results for input(s): "CHOL", "TRIG", "HDL", "LABVLDL", "LDLCALC", "CHOLHDL" in the last 168 hours.  Hematology Recent Labs  Lab 08/10/22 1539 08/10/22 1614 08/10/22 2338 08/11/22 0603  WBC 15.4*  --   --  14.0*  RBC 3.56*  --   --  3.32*  HGB 10.6* 9.9* 9.9* 9.7*  HCT 31.6* 29.0* 29.0* 29.0*  MCV 88.8  --   --  87.3  MCH 29.8  --   --  29.2  MCHC 33.5  --   --  33.4  RDW 12.0  --   --  12.2  PLT 192  --   --  161   Thyroid No results for input(s): "TSH", "FREET4" in the last 168 hours.  BNPNo results for input(s): "BNP", "PROBNP" in the last 168 hours.  DDimer No results for input(s): "DDIMER" in the last 168 hours.   Radiology/Studies:  DG C-Arm 1-60 Min-No Report  Result Date: 08/11/2022 Fluoroscopy was utilized by the requesting physician.  No radiographic interpretation.   CT Head Wo Contrast  Result Date: 08/10/2022 CLINICAL DATA:  Head trauma, minor (Age >= 65y) Fall off roof cleaning gutters. EXAM: CT HEAD WITHOUT CONTRAST TECHNIQUE: Contiguous axial images were obtained from the base of the skull through the vertex without intravenous contrast. RADIATION DOSE REDUCTION: This exam was performed according to the departmental dose-optimization program which includes automated exposure control, adjustment of the mA and/or kV according to patient size and/or use of iterative reconstruction technique. COMPARISON:  None Available. FINDINGS: Brain: No intracranial hemorrhage, mass effect, or midline shift. No hydrocephalus. The basilar cisterns are patent. No evidence of  territorial infarct or acute ischemia. No extra-axial or intracranial fluid collection. Vascular: No hyperdense vessel or unexpected calcification. Skull: No fracture or focal lesion. Sinuses/Orbits: Trace opacification of lower left mastoid air cells. The paranasal sinuses are clear Other: None. IMPRESSION: No acute intracranial abnormality. No skull fracture. Electronically Signed   By: Narda Rutherford M.D.   On: 08/10/2022 17:56   CT Cervical Spine Wo Contrast  Result Date: 08/10/2022 CLINICAL DATA:  Neck trauma (Age >= 65y) Fall from roof while cleaning gutters. EXAM: CT CERVICAL SPINE WITHOUT CONTRAST TECHNIQUE: Multidetector CT imaging of the cervical spine was performed without intravenous contrast. Multiplanar CT image reconstructions were also generated. RADIATION DOSE REDUCTION: This exam was performed according to the departmental dose-optimization program which includes automated exposure control, adjustment of the mA and/or kV according to patient size and/or use of iterative reconstruction technique. COMPARISON:  None Available. FINDINGS: Alignment: Normal. Skull base and vertebrae: No acute fracture. Vertebral body heights are maintained. The dens and skull base are intact. Soft tissues and spinal canal: No prevertebral fluid or swelling. No visible canal hematoma. Disc levels: Disc space narrowing and spurring at C5-C6 with adjacent Modic endplate changes. Upper chest: No acute apical findings, assessed fully on concurrent chest CT, reported separately. Other: None. IMPRESSION: Mild degenerative change in the cervical spine without acute fracture or subluxation. Electronically Signed   By: Narda Rutherford M.D.   On: 08/10/2022 17:52   CT CHEST ABDOMEN PELVIS W CONTRAST  Result Date: 08/10/2022 CLINICAL DATA:  Fall from roof  cleaning gutters. EXAM: CT CHEST, ABDOMEN, AND PELVIS WITH CONTRAST TECHNIQUE: Multidetector CT imaging of the chest, abdomen and pelvis was performed following the  standard protocol during bolus administration of intravenous contrast. RADIATION DOSE REDUCTION: This exam was performed according to the departmental dose-optimization program which includes automated exposure control, adjustment of the mA and/or kV according to patient size and/or use of iterative reconstruction technique. CONTRAST:  75mL OMNIPAQUE IOHEXOL 350 MG/ML SOLN COMPARISON:  None Available. FINDINGS: CT CHEST FINDINGS Cardiovascular: No aortic injury. No mediastinal hematoma. Great vessels normal. Mediastinum/Nodes: Trachea and esophagus normal. Lungs/Pleura: No pneumothorax.  No pulmonary contusion. 7 mm lingular pulmonary nodule.  (Image 101/series 4) Musculoskeletal: No fracture CT ABDOMEN AND PELVIS FINDINGS Hepatobiliary: No hepatic laceration. Pancreas: Normal pancreas. Spleen: No splenic laceration. Adrenals/urinary tract: Adrenal glands normal. Kidneys enhance symmetrically. Bladder intact. Stomach/Bowel: No bowel injury identified.  No mesenteric fluid. Vascular/Lymphatic: Abdominal aorta normal caliber. Iliac arteries normal. Reproductive: Other: No free fluid. Musculoskeletal: Comminuted fracture of the proximal RIGHT femur diaphysis and extending into intertrochanteric femur. The femoral head is located and varus angulated. No acetabular fracture identified. Intramuscular hematoma surrounds the comminuted femur fracture on the RIGHT. No evidence of active arterial bleeding. No pelvic fracture or sacral fracture identified. IMPRESSION: CHEST: 1. No thoracic injury. 2. Left solid pulmonary nodule measuring 7 mm. Per Fleischner Society Guidelines, recommend a non-contrast Chest CT at 6-12 months. If patient is high risk for malignancy, consider an additional non-contrast Chest CT at 18-24 months. If patient is low risk for malignancy, non-contrast Chest CT at 18-24 months is optional. These guidelines do not apply to immunocompromised patients and patients with cancer. Follow up in patients with  significant comorbidities as clinically warranted. For lung cancer screening, adhere to Lung-RADS guidelines. Reference: Radiology. 2017; 284(1):228-43. PELVIS: 1. Comminuted fracture of the proximal RIGHT femur diaphysis and extending into the intertrochanteric femur. Femoral head is located and varus angulated. 2. Large intramuscular hematoma surrounds the proximal RIGHT femur fracture. 3. No evidence of solid organ injury. Electronically Signed   By: Genevive Bi M.D.   On: 08/10/2022 17:47   DG Femur Min 2 Views Right  Result Date: 08/10/2022 CLINICAL DATA:  Right hip pain after fall off roof. EXAM: RIGHT FEMUR 2 VIEWS COMPARISON:  None Available. FINDINGS: Severely displaced and comminuted fracture is seen involving the intertrochanteric region and proximal shaft of the right femur. IMPRESSION: Severely displaced and comminuted proximal right femoral fracture as noted above. Electronically Signed   By: Lupita Raider M.D.   On: 08/10/2022 16:19   DG Chest Portable 1 View  Result Date: 08/10/2022 CLINICAL DATA:  Fall. EXAM: PORTABLE CHEST 1 VIEW COMPARISON:  Jun 02, 2022. FINDINGS: The heart size and mediastinal contours are within normal limits. Both lungs are clear. The visualized skeletal structures are unremarkable. IMPRESSION: No active disease. Electronically Signed   By: Lupita Raider M.D.   On: 08/10/2022 16:18   DG Pelvis Portable  Result Date: 08/10/2022 CLINICAL DATA:  Right hip pain after fall off roof. EXAM: PORTABLE PELVIS 1-2 VIEWS COMPARISON:  None Available. FINDINGS: Severely displaced and comminuted fracture is seen involving the intertrochanteric region and proximal shaft of the right femur. IMPRESSION: Severely displaced and comminuted proximal right femoral fracture as noted above. Electronically Signed   By: Lupita Raider M.D.   On: 08/10/2022 16:17     Assessment and Plan:   R hip fracture  - occurred after falling off room while attempting to clean gutter -  underwent pin traction on the night of 8/7 by Dr. Charlann Boxer, pending discussion between Dr. Charlann Boxer and ortho trauma team to finalize surgery plan - ideally need surgery early to avoid being off of Brilinta for too long due to risk of stent thrombosis. Discussed case with Dr. Bjorn Pippin, recommend resume ASA 81mg  daily. Restart Brilinta as soon as possible after surgery at the discretion of surgeon based on bleeding risk   Anemia: Hgb dropped from previous 13 down to 9.7, stable since yesterday  CAD s/p recent DES to prox LAD  - last dose of ASA and Brilinta around 8AM on 8/7. History of dyspnea on brilinta, but tolerating.   - discussed with Dr. Bjorn Pippin who recommended resume ASA, ok to be off of Brilinta temporarily, would restart as soon as able after surgery  H/o syncope: patient had bradycardia down to 30s and positive orthostatic hypotension that lead to syncope in May, beta blocker removed at the time. 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 Euvolemic on exam  Hyperlipidemia: resume lipitor 80mg  daily  Hypothyroidism: may resume thyroid medication, will defer management to primary team  CKD stage III: Cr at baseline    Risk Assessment/Risk Scores:             For questions or updates, please contact [facility] Please consult [email or web address] for contact info under    Ramond Dial, [place]  08/11/2022 10:19 AM  Patient seen and examined.  Agree with above documentation.  Gregory Gibbs is a 77 year old male with a history of CAD, chronic systolic heart failure, hyperlipidemia, orthostatic hypotension, CKD stage III, hypothyroidism who are consulted for evaluation of CAD/preop evaluation at the request of Dr. Charlann Boxer.  He was admitted in [facility] 05/2022 for exertional chest pain.  Underwent nuclear stress test that was concerning for ischemia.  Transferred to [facility] and underwent cath on 06/03/2022 which showed 90% proximal LAD lesion status post DES, 75% OM 1 lesion for which medical  management was recommended.  Patient did have an episode of syncope during hospitalization after standing up to walk to the bathroom.  Found to have positive orthostatics, his beta-blocker was discontinued.  He was discharged on aspirin, ticagrelor, and atorvastatin.  He reports no further chest pain since his discharge.  Yesterday he was cleaning the gutter on his roof when he missed a step and fell off the roof and landed on his right hip.  On presentation to the ED, found to have right femur fracture.  He was taken to the OR last night and underwent placement of right lower extremity traction pin.  Most recent vital signs show BP 110/60, pulse 71, SpO2 99% on room air.  Labs notable for creatinine 1.38, hemoglobin 9.7, WBC 14.0.  EKG shows normal sinus rhythm, rate 70, low voltage, nonspecific T wave flattening.  On exam, patient is alert and oriented, regular rate and rhythm, no murmurs, lungs CTAB, no LE edema or JVD.  For his antiplatelet medications, want to minimize time off DAPT given his stent placed 05/2022.  Will resume aspirin 81 mg daily.  Will need to hold Brilinta for his surgery, would restart as soon as okay from bleeding standpoint per surgeon.  In regards to preop evaluation, he reports no recent exertional chest pain or dyspnea.  No further cardiac workup recommended prior to surgery.  Little Ishikawa, MD

## 2022-08-11 NOTE — Anesthesia Procedure Notes (Addendum)
Procedure Name: Intubation Date/Time: 08/10/2022 11:57 PM  Performed by: Tressia Miners, CRNAPre-anesthesia Checklist: Patient identified, Emergency Drugs available, Suction available, Patient being monitored and Timeout performed Patient Re-evaluated:Patient Re-evaluated prior to induction Oxygen Delivery Method: Circle system utilized Preoxygenation: Pre-oxygenation with 100% oxygen Induction Type: IV induction Ventilation: Mask ventilation without difficulty Laryngoscope Size: Mac and 4 Grade View: Grade I Tube type: Oral Tube size: 7.5 mm Number of attempts: 2 Airway Equipment and Method: Stylet Placement Confirmation: ETT inserted through vocal cords under direct vision, positive ETCO2 and breath sounds checked- equal and bilateral Secured at: 23 cm Tube secured with: Tape Dental Injury: Teeth and Oropharynx as per pre-operative assessment  Comments: Smooth IV Induction. Eyes taped. Easy mask. DL x 1 with grade 1 view. Atraumatically placed, teeth and lip remain intact as pre-op. Secured with tape. Bilateral breath sounds +/=, EtCO2 +, Adequate TV, VSS.

## 2022-08-11 NOTE — Progress Notes (Signed)
   08/11/22 1313  TOC Brief Assessment  Insurance and Status Reviewed  Patient has primary care physician Yes  Prior level of function: independent  Prior/Current Home Services No current home services  Social Determinants of Health Reivew SDOH reviewed no interventions necessary  Readmission risk has been reviewed Yes     Await care plan and PT/OT evaluations and recommendations.

## 2022-08-11 NOTE — Op Note (Signed)
NAMEKRISTO, Gregory Gibbs MEDICAL RECORD NO: [id number] ACCOUNT NO: [id number] DATE OF BIRTH: [birth year] FACILITY: [facility] LOCATION: [facility] PHYSICIAN: Madlyn Frankel. Charlann Boxer, MD  Operative Report   DATE OF PROCEDURE: 08/10/2022  PREOPERATIVE DIAGNOSIS:  Comminuted right proximal femur fracture.  POSTOPERATIVE DIAGNOSIS:  Comminuted right proximal femur fracture.  PROCEDURES PERFORMED:  Closed reduction of right proximal femur fracture with placement of a lower extremity traction pin for skeletal traction.  We used a 2.4 mm pin with 15 pounds of traction.  SURGEON:  Madlyn Frankel. Charlann Boxer, MD  ASSISTANT:  Rosalene Billings, PA-C.  Note, Ms. Domenic Schwab was present for the entirety of the case with maintenance of orientation of the leg during placement of the pin, general facilitation of the case.  ANESTHESIA:  General.  BLOOD LOSS:  None.  DRAINS:  None.  INDICATIONS FOR PROCEDURE:  The patient is a 77 year old male who unfortunately fell off his roof while he was trying to get leaves out of the gutter.  He landed directly on his right hip.  He had immediate onset of pain and deformity, was brought to the  emergency room where radiographs revealed a complex comminuted right proximal femur fracture with extension through the intertrochanteric segment extending down to the proximal third of the femur.  Importantly, he is status post placement of a coronary  stent in his left anterior descending approximately 2 months ago.  He has been placed on Brilinta.  There was concerns regarding the nature of the operative repair that would be required to fix the fracture and the increased risk of bleeding, without the  ability to reverse or to help provide agents to minimize bleeding, without raising concern for clotting off his new stents.  Based on the discussions that I had with anesthesia, we changed our plan from open reduction and internal fixation and placement  of a traction pin, to have a cardiology consult  performed and better came with the game plan prior to him having definitive procedure, not knowing the exact duration that the procedure would be as well as blood loss.  I reviewed this plan with the  patient and then with his family.  We discussed the future need for surgeries and the risks that were reviewed.  Consent was obtained for this initial fixation and stabilization of his fracture.  DESCRIPTION OF PROCEDURE:  The patient was brought to the operative theater.  Once adequate anesthesia, general anesthetic administered, he was positioned supine.  No Ancef was administered.  A timeout was performed identifying the patient, planned  procedure, and extremity.  We then cleanly prepped out the proximal tibia after shaving the proximal tibia.  I then used a #2 drill to place a 2.4 mm K-wire pin inferior to the tibial tubercle within the metaphysis of the tibia.  Once this was in place,  we applied the traction bow.  We then set up retraction at the foot of the bed, trying our best to make certain that there was no pressure on his foot or his leg.  We placed 2 ABD pads under the traction boot apparatus to keep it off his proximal tibia.   We angled the tension string with the weight on it away from his toe.  His leg was in a very neutral position at this point. Once we finished all the manipulation of the lower extremity, the case was concluded.  We cleaned his leg.  Applied some  stoppers at the end of the pin and then he was awoken from  anesthesia.  He tolerated the procedure well.  There was no significant or concerning events.  He will be admitted to the floor.  He will be bedridden.  I discussed this case with Dr. Truitt Merle, orthopedic trauma surgery to come up with the game plan for definitive fixation.  We will also consult cardiology for perioperative management and  direction with regards to blood thinners while in the hospital given the requisite procedure.   MUK D: 08/11/2022 12:37:19 am  T: 08/11/2022 12:58:00 am  JOB: [id number]/ [id number]

## 2022-08-11 NOTE — Anesthesia Preprocedure Evaluation (Addendum)
Anesthesia Evaluation  Patient identified by MRN, date of birth, ID band Patient awake    Reviewed: Allergy & Precautions, NPO status , Patient's Chart, lab work & pertinent test results  History of Anesthesia Complications Negative for: history of anesthetic complications  Airway Mallampati: I  TM Distance: >3 FB Neck ROM: Full   Comment: Previous grade I view with MAC 4, easy mask Dental  (+) Upper Dentures, Lower Dentures   Pulmonary neg shortness of breath, neg sleep apnea, neg COPD, neg recent URI, former smoker   Pulmonary exam normal breath sounds clear to auscultation       Cardiovascular Exercise Tolerance: Good + angina (none since stent placement)  + CAD and + Cardiac Stents (DES to LAD 06/03/2022)  + Valvular Problems/Murmurs (mild) MR  Rhythm:Regular Rate:Normal  HLD  TTE 06/04/2022: IMPRESSIONS     1. Left ventricular ejection fraction, by estimation, is 45 to 50%. The  left ventricle has mildly decreased function. The left ventricle  demonstrates global hypokinesis. Left ventricular diastolic parameters are  consistent with Grade I diastolic  dysfunction (impaired relaxation).   2. Right ventricular systolic function is normal. The right ventricular  size is normal. Tricuspid regurgitation signal is inadequate for assessing  PA pressure.   3. The mitral valve is normal in structure. Mild mitral valve  regurgitation.   4. The aortic valve is tricuspid. Aortic valve regurgitation is not  visualized.   5. The inferior vena cava is normal in size with greater than 50%  respiratory variability, suggesting right atrial pressure of 3 mmHg.   LHC 06/03/2022:   Prox LAD lesion is 90% stenosed.   1st Diag lesion is 45% stenosed.   1st Mrg lesion is 75% stenosed.   A drug-eluting stent was successfully placed using a SYNERGY XD 3.0X12.   Post intervention, there is a 0% residual stenosis.   The left ventricular  systolic function is normal.   LV end diastolic pressure is mildly elevated.   The left ventricular ejection fraction is 55-65% by visual estimate.     Neuro/Psych neg Seizures  Neuromuscular disease (chronic neck pain)    GI/Hepatic Neg liver ROS,GERD  Medicated,,  Endo/Other  neg diabetesHypothyroidism    Renal/GU CRFRenal disease     Musculoskeletal  (+) Arthritis , Osteoarthritis,    Abdominal   Peds  Hematology  (+) Blood dyscrasia, anemia Lab Results      Component                Value               Date                      WBC                      16.5 (H)            08/12/2022                HGB                      7.7 (L)             08/12/2022                HCT                      23.3 (L)  08/12/2022                MCV                      88.3                08/12/2022                PLT                      145 (L)             08/12/2022                   Anesthesia Other Findings 77 y.o. male with a hx of recently diagnosed CAD, ICM/HFmrEF, HLD, syncope with positive orthostatic hypotension, CKD stage III and hypothyroidism  Last Brilinta: 08/10/2022  Last aspirin:  Reproductive/Obstetrics                              Anesthesia Physical Anesthesia Plan  ASA: 4  Anesthesia Plan: General   Post-op Pain Management: Tylenol PO (pre-op)*   Induction: Intravenous  PONV Risk Score and Plan: 2 and Ondansetron, Dexamethasone and Treatment may vary due to age or medical condition  Airway Management Planned: Oral ETT  Additional Equipment:   Intra-op Plan:   Post-operative Plan: Extubation in OR  Informed Consent: I have reviewed the patients History and Physical, chart, labs and discussed the procedure including the risks, benefits and alternatives for the proposed anesthesia with the patient or authorized representative who has indicated his/her understanding and acceptance.     Dental advisory  given  Plan Discussed with: CRNA and Anesthesiologist  Anesthesia Plan Comments: (Plan for 1 unit pRBCs for Hgb <8. Patient to receive his aspirin preop.  Risks of general anesthesia discussed including, but not limited to, sore throat, hoarse voice, chipped/damaged teeth, injury to vocal cords, nausea and vomiting, allergic reactions, lung infection, heart attack, stroke, and death. All questions answered. )         Anesthesia Quick Evaluation

## 2022-08-11 NOTE — Progress Notes (Signed)
Notified MDA who is at the bedside.  No new orders at this time.

## 2022-08-11 NOTE — Progress Notes (Signed)
Subjective: 1 Day Post-Op Procedure(s) (LRB): RIGHT PLACEMENT OF RIGHT LOWER EXTREMITY TRACTION PIN (Right) Patient reports pain as mild.   Patient seen in rounds for Dr. Charlann Boxer. Patient is resting in bed on exam this morning. No acute events overnight. Patient tells me he slept well last night until he was woken for labs. We reviewed again the plan today.    Objective: Vital signs in last 24 hours: Temp:  [98 F (36.7 C)-98.9 F (37.2 C)] 98 F (36.7 C) (08/08 0432) Pulse Rate:  [71-83] 72 (08/08 0432) Resp:  [16-23] 20 (08/08 0052) BP: (102-122)/(57-78) 107/60 (08/08 0432) SpO2:  [94 %-100 %] 97 % (08/08 0432) Weight:  [68 kg] 68 kg (08/07 1553)  Intake/Output from previous day:  Intake/Output Summary (Last 24 hours) at 08/11/2022 0912 Last data filed at 08/11/2022 0257 Gross per 24 hour  Intake 772.87 ml  Output --  Net 772.87 ml     Intake/Output this shift: No intake/output data recorded.  Labs: Recent Labs    08/10/22 1539 08/10/22 1614 08/10/22 2338 08/11/22 0603  HGB 10.6* 9.9* 9.9* 9.7*   Recent Labs    08/10/22 1539 08/10/22 1614 08/10/22 2338 08/11/22 0603  WBC 15.4*  --   --  14.0*  RBC 3.56*  --   --  3.32*  HCT 31.6*   < > 29.0* 29.0*  PLT 192  --   --  161   < > = values in this interval not displayed.   Recent Labs    08/10/22 1539 08/10/22 1614 08/10/22 2338 08/11/22 0323  NA 138   < > 139 138  K 3.0*   < > 4.4 4.5  CL 112*   < > 105 109  CO2 19*  --   --  21*  BUN 9   < > 13 12  CREATININE 1.14   < > 1.20 1.38*  GLUCOSE 108*   < > 117* 129*  CALCIUM 6.6*  --   --  8.1*   < > = values in this interval not displayed.   No results for input(s): "LABPT", "INR" in the last 72 hours.  Exam: General - Patient is Alert and Oriented Extremity - Neurologically intact Sensation intact distally Intact pulses distally Dorsiflexion/Plantar flexion intact Dressing: Traction pin in place with surrounding skin well padded. Motor Function  - intact, moving foot and toes well on exam.   Past Medical History:  Diagnosis Date   Acquired hypothyroidism 03/25/2014   Acute sinusitis 03/25/2014   Chronic foot pain 03/25/2014   Chronic neck pain 11/04/2014   Cough 03/25/2014   Elevated PSA 02/04/2020   Gastro-esophageal reflux disease without esophagitis 03/25/2014   GERD (gastroesophageal reflux disease)    History of Helicobacter pylori infection 03/25/2014   Hypothyroidism    Infection due to 2019 novel coronavirus 08/06/2018   Formatting of this note might be different from the original. 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   Weight as of this encounter: 68 kg.   Dr. Charlann Boxer to discuss with ortho trauma today regarding definitive management.   We will get Cardio consult today for peri-operative planning due to recent cardiac stent and  pending surgery.  He denies pain elsewhere today.  Rosalene Billings, PA-C Orthopedic Surgery [phone] 08/11/2022, 9:12 AM

## 2022-08-11 NOTE — Transfer of Care (Signed)
Immediate Anesthesia Transfer of Care Note  Patient: Gregory Gibbs  Procedure(s) Performed: RIGHT PLACEMENT OF RIGHT LOWER EXTREMITY TRACTION PIN (Right: Leg Upper)  Patient Location: PACU  Anesthesia Type:General  Level of Consciousness: awake, alert , and patient cooperative  Airway & Oxygen Therapy: Patient Spontanous Breathing  Post-op Assessment: Report given to RN, Post -op Vital signs reviewed and stable, and Patient moving all extremities X 4  Post vital signs: Reviewed and stable  Last Vitals:  Vitals Value Taken Time  BP 122/62 08/11/22 0026  Temp    Pulse 75 08/11/22 0029  Resp 20 08/11/22 0029  SpO2 96 % 08/11/22 0029  Vitals shown include unfiled device data.  Last Pain:  Vitals:   08/10/22 2045  TempSrc:   PainSc: 7          Complications: No notable events documented.

## 2022-08-11 NOTE — Brief Op Note (Signed)
08/11/2022  12:30 AM  PATIENT:  Gregory Gibbs  77 y.o. male  PRE-OPERATIVE DIAGNOSIS:  Comminuted right proximal femur fracture  POST-OPERATIVE DIAGNOSIS:  Comminuted right proximal femur fracture  PROCEDURE:  Procedure(s): RIGHT PLACEMENT OF RIGHT LOWER EXTREMITY TRACTION PIN (Right)  SURGEON:  Surgeons and Role:    Durene Romans, MD - Primary  PHYSICIAN ASSISTANT: Rosalene Billings, PA-C  ANESTHESIA:   general  EBL:  None  BLOOD ADMINISTERED:none  DRAINS: none   LOCAL MEDICATIONS USED:  NONE  SPECIMEN:  No Specimen  DISPOSITION OF SPECIMEN:  N/A  COUNTS:  YES  TOURNIQUET:  * No tourniquets in log *  DICTATION: .Other Dictation: Dictation Number [id number]  PLAN OF CARE: Admit to inpatient   PATIENT DISPOSITION:  PACU - hemodynamically stable.   Delay start of Pharmacological VTE agent (>24hrs) due to surgical blood loss or risk of bleeding: no

## 2022-08-11 NOTE — Anesthesia Postprocedure Evaluation (Signed)
Anesthesia Post Note  Patient: Gregory Gibbs  Procedure(s) Performed: RIGHT PLACEMENT OF RIGHT LOWER EXTREMITY TRACTION PIN (Right: Leg Upper)     Patient location during evaluation: PACU Anesthesia Type: General Level of consciousness: awake and alert, patient cooperative and oriented Pain management: pain level controlled Vital Signs Assessment: post-procedure vital signs reviewed and stable Respiratory status: spontaneous breathing, nonlabored ventilation and respiratory function stable Cardiovascular status: blood pressure returned to baseline and stable Postop Assessment: no apparent nausea or vomiting and adequate PO intake Anesthetic complications: no   No notable events documented.  Last Vitals:  Vitals:   08/11/22 0030 08/11/22 0045  BP: 116/61 (!) 102/57  Pulse: 80 74  Resp: (!) 22 18  Temp:    SpO2: 95% 94%    Last Pain:  Vitals:   08/11/22 0030  TempSrc:   PainSc: 5                  ,E. 

## 2022-08-12 ENCOUNTER — Inpatient Hospital Stay (HOSPITAL_COMMUNITY): Payer: Medicare HMO

## 2022-08-12 ENCOUNTER — Encounter (HOSPITAL_COMMUNITY): Payer: Self-pay | Admitting: Orthopedic Surgery

## 2022-08-12 ENCOUNTER — Encounter (HOSPITAL_COMMUNITY)
Admission: EM | Disposition: A | Discharge status: Skilled Nursing Facility | Payer: Self-pay | Source: Home / Self Care | Attending: Internal Medicine

## 2022-08-12 ENCOUNTER — Inpatient Hospital Stay (HOSPITAL_COMMUNITY): Payer: Medicare HMO | Admitting: Anesthesiology

## 2022-08-12 ENCOUNTER — Other Ambulatory Visit: Payer: Self-pay

## 2022-08-12 DIAGNOSIS — I5022 Chronic systolic (congestive) heart failure: Secondary | ICD-10-CM

## 2022-08-12 DIAGNOSIS — Z87891 Personal history of nicotine dependence: Secondary | ICD-10-CM

## 2022-08-12 DIAGNOSIS — I2510 Atherosclerotic heart disease of native coronary artery without angina pectoris: Secondary | ICD-10-CM | POA: Diagnosis not present

## 2022-08-12 DIAGNOSIS — I25119 Atherosclerotic heart disease of native coronary artery with unspecified angina pectoris: Secondary | ICD-10-CM | POA: Diagnosis not present

## 2022-08-12 DIAGNOSIS — S7291XA Unspecified fracture of right femur, initial encounter for closed fracture: Secondary | ICD-10-CM | POA: Diagnosis not present

## 2022-08-12 DIAGNOSIS — S72141A Displaced intertrochanteric fracture of right femur, initial encounter for closed fracture: Secondary | ICD-10-CM

## 2022-08-12 HISTORY — PX: INTRAMEDULLARY (IM) NAIL INTERTROCHANTERIC: SHX5875

## 2022-08-12 LAB — PREPARE RBC (CROSSMATCH)

## 2022-08-12 SURGERY — FIXATION, FRACTURE, INTERTROCHANTERIC, WITH INTRAMEDULLARY ROD
Anesthesia: General | Laterality: Right

## 2022-08-12 MED ORDER — AMISULPRIDE (ANTIEMETIC) 5 MG/2ML IV SOLN: 10.0000 mg | Freq: Once | INTRAVENOUS | Status: DC | PRN

## 2022-08-12 MED ORDER — ROCURONIUM BROMIDE 10 MG/ML (PF) SYRINGE
PREFILLED_SYRINGE | INTRAVENOUS | Status: DC | PRN
  Administered 2022-08-12: 50 mg via INTRAVENOUS
  Administered 2022-08-12: 10 mg via INTRAVENOUS

## 2022-08-12 MED ORDER — CEFAZOLIN SODIUM-DEXTROSE 2-3 GM-%(50ML) IV SOLR
INTRAVENOUS | Status: DC | PRN
  Administered 2022-08-12: 2 g via INTRAVENOUS

## 2022-08-12 MED ORDER — EPHEDRINE 5 MG/ML INJ
INTRAVENOUS | Status: AC
  Filled 2022-08-12: qty 5

## 2022-08-12 MED ORDER — EPHEDRINE SULFATE-NACL 50-0.9 MG/10ML-% IV SOSY
PREFILLED_SYRINGE | INTRAVENOUS | Status: DC | PRN
  Administered 2022-08-12 (×2): 5 mg via INTRAVENOUS

## 2022-08-12 MED ORDER — DEXAMETHASONE SODIUM PHOSPHATE 10 MG/ML IJ SOLN
INTRAMUSCULAR | Status: AC
  Filled 2022-08-12: qty 1

## 2022-08-12 MED ORDER — OXYCODONE HCL 5 MG PO TABS
5.0000 mg | ORAL_TABLET | ORAL | Status: DC | PRN
  Administered 2022-08-14 – 2022-08-16 (×10): 10 mg via ORAL
  Administered 2022-08-16 – 2022-08-18 (×7): 5 mg via ORAL
  Administered 2022-08-18: 10 mg via ORAL
  Filled 2022-08-12 (×2): qty 2
  Filled 2022-08-12: qty 1
  Filled 2022-08-12 (×2): qty 2
  Filled 2022-08-12: qty 1
  Filled 2022-08-12: qty 2
  Filled 2022-08-12: qty 1
  Filled 2022-08-12 (×2): qty 2
  Filled 2022-08-12 (×2): qty 1
  Filled 2022-08-12 (×5): qty 2
  Filled 2022-08-12: qty 1

## 2022-08-12 MED ORDER — LACTATED RINGERS IV SOLN: INTRAVENOUS | Status: DC

## 2022-08-12 MED ORDER — OXYCODONE HCL 5 MG PO TABS
5.0000 mg | ORAL_TABLET | Freq: Once | ORAL | Status: AC | PRN
  Administered 2022-08-12: 5 mg via ORAL

## 2022-08-12 MED ORDER — OXYCODONE HCL 5 MG PO TABS
ORAL_TABLET | ORAL | Status: AC
  Filled 2022-08-12: qty 1

## 2022-08-12 MED ORDER — LIDOCAINE 2% (20 MG/ML) 5 ML SYRINGE
INTRAMUSCULAR | Status: DC | PRN
  Administered 2022-08-12: 80 mg via INTRAVENOUS

## 2022-08-12 MED ORDER — SUGAMMADEX SODIUM 200 MG/2ML IV SOLN
INTRAVENOUS | Status: DC | PRN
  Administered 2022-08-12 (×2): 100 mg via INTRAVENOUS

## 2022-08-12 MED ORDER — CHLORHEXIDINE GLUCONATE 0.12 % MT SOLN: 15.0000 mL | Freq: Once | OROMUCOSAL | Status: AC

## 2022-08-12 MED ORDER — STERILE WATER FOR IRRIGATION IR SOLN
Status: DC | PRN
Start: 2022-08-12 — End: 2022-08-12
  Administered 2022-08-12: 1000 mL

## 2022-08-12 MED ORDER — PHENYLEPHRINE 80 MCG/ML (10ML) SYRINGE FOR IV PUSH (FOR BLOOD PRESSURE SUPPORT)
PREFILLED_SYRINGE | INTRAVENOUS | Status: DC | PRN
  Administered 2022-08-12: 80 ug via INTRAVENOUS
  Administered 2022-08-12: 160 ug via INTRAVENOUS
  Administered 2022-08-12 (×2): 80 ug via INTRAVENOUS
  Administered 2022-08-12 (×4): 160 ug via INTRAVENOUS

## 2022-08-12 MED ORDER — ETOMIDATE 2 MG/ML IV SOLN
INTRAVENOUS | Status: AC
  Filled 2022-08-12: qty 10

## 2022-08-12 MED ORDER — CHLORHEXIDINE GLUCONATE 0.12 % MT SOLN
OROMUCOSAL | Status: AC
  Administered 2022-08-12: 15 mL via OROMUCOSAL
  Filled 2022-08-12: qty 15

## 2022-08-12 MED ORDER — PHENYLEPHRINE HCL-NACL 20-0.9 MG/250ML-% IV SOLN
INTRAVENOUS | Status: DC | PRN
  Administered 2022-08-12: 30 ug/min via INTRAVENOUS
  Administered 2022-08-12: 75 ug/min via INTRAVENOUS

## 2022-08-12 MED ORDER — PROPOFOL 10 MG/ML IV BOLUS
INTRAVENOUS | Status: DC | PRN
Start: 2022-08-12 — End: 2022-08-12
  Administered 2022-08-12: 20 mg via INTRAVENOUS
  Administered 2022-08-12: 100 mg via INTRAVENOUS

## 2022-08-12 MED ORDER — PHENYLEPHRINE 80 MCG/ML (10ML) SYRINGE FOR IV PUSH (FOR BLOOD PRESSURE SUPPORT)
PREFILLED_SYRINGE | INTRAVENOUS | Status: AC
  Filled 2022-08-12: qty 10

## 2022-08-12 MED ORDER — 0.9 % SODIUM CHLORIDE (POUR BTL) OPTIME
TOPICAL | Status: DC | PRN
  Administered 2022-08-12: 1000 mL

## 2022-08-12 MED ORDER — FENTANYL CITRATE (PF) 250 MCG/5ML IJ SOLN
INTRAMUSCULAR | Status: DC | PRN
  Administered 2022-08-12 (×3): 50 ug via INTRAVENOUS

## 2022-08-12 MED ORDER — PROPOFOL 10 MG/ML IV BOLUS
INTRAVENOUS | Status: AC
  Filled 2022-08-12: qty 20

## 2022-08-12 MED ORDER — FENTANYL CITRATE (PF) 100 MCG/2ML IJ SOLN
25.0000 ug | INTRAMUSCULAR | Status: DC | PRN
  Administered 2022-08-12: 25 ug via INTRAVENOUS
  Administered 2022-08-12: 50 ug via INTRAVENOUS

## 2022-08-12 MED ORDER — ACETAMINOPHEN 325 MG PO TABS
650.0000 mg | ORAL_TABLET | Freq: Four times a day (QID) | ORAL | Status: DC
  Administered 2022-08-12 – 2022-08-18 (×23): 650 mg via ORAL
  Filled 2022-08-12 (×23): qty 2

## 2022-08-12 MED ORDER — POLYETHYLENE GLYCOL 3350 17 G PO PACK
17.0000 g | PACK | Freq: Every day | ORAL | Status: DC | PRN
  Administered 2022-08-16 – 2022-08-18 (×2): 17 g via ORAL
  Filled 2022-08-12 (×2): qty 1

## 2022-08-12 MED ORDER — CEFAZOLIN SODIUM-DEXTROSE 2-4 GM/100ML-% IV SOLN
INTRAVENOUS | Status: AC
  Filled 2022-08-12: qty 100

## 2022-08-12 MED ORDER — ACETAMINOPHEN 500 MG PO TABS
1000.0000 mg | ORAL_TABLET | Freq: Once | ORAL | Status: AC
  Administered 2022-08-12: 1000 mg via ORAL
  Filled 2022-08-12: qty 2

## 2022-08-12 MED ORDER — SODIUM CHLORIDE 0.9 % IV SOLN
INTRAVENOUS | Status: DC | PRN
Start: 2022-08-12 — End: 2022-08-12

## 2022-08-12 MED ORDER — FENTANYL CITRATE (PF) 250 MCG/5ML IJ SOLN
INTRAMUSCULAR | Status: AC
  Filled 2022-08-12: qty 5

## 2022-08-12 MED ORDER — ONDANSETRON HCL 4 MG/2ML IJ SOLN
INTRAMUSCULAR | Status: DC | PRN
  Administered 2022-08-12: 4 mg via INTRAVENOUS

## 2022-08-12 MED ORDER — DEXAMETHASONE SODIUM PHOSPHATE 10 MG/ML IJ SOLN
INTRAMUSCULAR | Status: DC | PRN
  Administered 2022-08-12: 5 mg via INTRAVENOUS

## 2022-08-12 MED ORDER — OXYCODONE HCL 5 MG/5ML PO SOLN: 5.0000 mg | Freq: Once | ORAL | Status: AC | PRN

## 2022-08-12 MED ORDER — ONDANSETRON HCL 4 MG/2ML IJ SOLN
INTRAMUSCULAR | Status: AC
  Filled 2022-08-12: qty 2

## 2022-08-12 MED ORDER — FENTANYL CITRATE (PF) 100 MCG/2ML IJ SOLN
INTRAMUSCULAR | Status: AC
  Filled 2022-08-12: qty 2

## 2022-08-12 MED ORDER — ORAL CARE MOUTH RINSE: 15.0000 mL | Freq: Once | OROMUCOSAL | Status: AC

## 2022-08-12 MED ORDER — CEFAZOLIN SODIUM-DEXTROSE 2-4 GM/100ML-% IV SOLN
2.0000 g | Freq: Three times a day (TID) | INTRAVENOUS | Status: AC
  Administered 2022-08-12 – 2022-08-13 (×3): 2 g via INTRAVENOUS
  Filled 2022-08-12 (×3): qty 100

## 2022-08-12 MED ORDER — LIDOCAINE 2% (20 MG/ML) 5 ML SYRINGE
INTRAMUSCULAR | Status: AC
  Filled 2022-08-12: qty 5

## 2022-08-12 MED ORDER — ROCURONIUM BROMIDE 10 MG/ML (PF) SYRINGE
PREFILLED_SYRINGE | INTRAVENOUS | Status: AC
  Filled 2022-08-12: qty 10

## 2022-08-12 SURGICAL SUPPLY — 53 items
ADH SKN CLS APL DERMABOND .7 (GAUZE/BANDAGES/DRESSINGS) ×1
APL PRP STRL LF DISP 70% ISPRP (MISCELLANEOUS) ×1
BAG COUNTER SPONGE SURGICOUNT (BAG) IMPLANT
BAG SPNG CNTER NS LX DISP (BAG)
BIT DRILL INTERTAN LAG SCREW (BIT) IMPLANT
BIT DRILL SHORT 4.0 (BIT) IMPLANT
BRUSH SCRUB EZ PLAIN DRY (MISCELLANEOUS) ×2 IMPLANT
CHLORAPREP W/TINT 26 (MISCELLANEOUS) ×1 IMPLANT
COVER PERINEAL POST (MISCELLANEOUS) ×1 IMPLANT
COVER SURGICAL LIGHT HANDLE (MISCELLANEOUS) ×1 IMPLANT
DERMABOND ADVANCED .7 DNX12 (GAUZE/BANDAGES/DRESSINGS) ×1 IMPLANT
DRAPE C-ARM 35X43 STRL (DRAPES) ×1 IMPLANT
DRAPE IMP U-DRAPE 54X76 (DRAPES) ×2 IMPLANT
DRAPE INCISE IOBAN 66X45 STRL (DRAPES) ×1 IMPLANT
DRAPE STERI IOBAN 125X83 (DRAPES) ×1 IMPLANT
DRAPE SURG 17X23 STRL (DRAPES) ×2 IMPLANT
DRAPE U-SHAPE 47X51 STRL (DRAPES) ×1 IMPLANT
DRESSING MEPILEX FLEX 4X4 (GAUZE/BANDAGES/DRESSINGS) ×1 IMPLANT
DRILL BIT SHORT 4.0 (BIT) ×2
DRSG MEPILEX BORD LITE 2X5 (GAUZE/BANDAGES/DRESSINGS) IMPLANT
DRSG MEPILEX FLEX 4X4 (GAUZE/BANDAGES/DRESSINGS) ×2
DRSG MEPILEX POST OP 4X8 (GAUZE/BANDAGES/DRESSINGS) ×1 IMPLANT
ELECT REM PT RETURN 9FT ADLT (ELECTROSURGICAL) ×1
ELECTRODE REM PT RTRN 9FT ADLT (ELECTROSURGICAL) ×1 IMPLANT
GLOVE BIO SURGEON STRL SZ 6.5 (GLOVE) ×3 IMPLANT
GLOVE BIO SURGEON STRL SZ7.5 (GLOVE) ×4 IMPLANT
GLOVE BIOGEL PI IND STRL 6.5 (GLOVE) ×1 IMPLANT
GLOVE BIOGEL PI IND STRL 7.5 (GLOVE) ×1 IMPLANT
GOWN STRL REUS W/ TWL LRG LVL3 (GOWN DISPOSABLE) ×1 IMPLANT
GOWN STRL REUS W/TWL LRG LVL3 (GOWN DISPOSABLE) ×1
GUIDE PIN 3.2X343 (PIN) ×2
GUIDE PIN 3.2X343MM (PIN) ×2
GUIDE ROD 3.0 (MISCELLANEOUS) ×2
KIT BASIN OR (CUSTOM PROCEDURE TRAY) ×1 IMPLANT
KIT TURNOVER KIT B (KITS) ×1 IMPLANT
MANIFOLD NEPTUNE II (INSTRUMENTS) ×1 IMPLANT
NAIL LOCK CANN 10X380 130D RT (Nail) IMPLANT
NS IRRIG 1000ML POUR BTL (IV SOLUTION) ×1 IMPLANT
PACK GENERAL/GYN (CUSTOM PROCEDURE TRAY) ×1 IMPLANT
PAD ARMBOARD 7.5X6 YLW CONV (MISCELLANEOUS) ×2 IMPLANT
PIN GUIDE 3.2X343MM (PIN) IMPLANT
ROD GUIDE 3.0 (MISCELLANEOUS) IMPLANT
SCREW LAG COMPR KIT 100/95 (Screw) IMPLANT
SCREW TRIGEN LOW PROF 5.0X42.5 (Screw) IMPLANT
SCREW TRIGEN LOW PROF 5.0X45 (Screw) IMPLANT
SUT MNCRL AB 3-0 PS2 18 (SUTURE) ×1 IMPLANT
SUT MON AB 2-0 CT1 36 (SUTURE) IMPLANT
SUT VIC AB 0 CT1 27 (SUTURE)
SUT VIC AB 0 CT1 27XBRD ANBCTR (SUTURE) IMPLANT
SUT VIC AB 2-0 CT1 27 (SUTURE)
SUT VIC AB 2-0 CT1 TAPERPNT 27 (SUTURE) ×2 IMPLANT
TOWEL GREEN STERILE (TOWEL DISPOSABLE) ×2 IMPLANT
WATER STERILE IRR 1000ML POUR (IV SOLUTION) ×1 IMPLANT

## 2022-08-12 NOTE — Anesthesia Procedure Notes (Signed)
Procedure Name: Intubation Date/Time: 08/12/2022 7:54 AM  Performed by: Audie Pinto, CRNAPre-anesthesia Checklist: Patient identified, Emergency Drugs available, Suction available and Patient being monitored Patient Re-evaluated:Patient Re-evaluated prior to induction Oxygen Delivery Method: Circle system utilized Preoxygenation: Pre-oxygenation with 100% oxygen Induction Type: IV induction Ventilation: Mask ventilation without difficulty Laryngoscope Size: Mac and 4 Grade View: Grade I Tube type: Oral Tube size: 7.5 mm Number of attempts: 1 Airway Equipment and Method: Stylet and Oral airway Placement Confirmation: ETT inserted through vocal cords under direct vision, positive ETCO2 and breath sounds checked- equal and bilateral Secured at: 23 cm Tube secured with: Tape Dental Injury: Teeth and Oropharynx as per pre-operative assessment  Comments: DL x1 with MAC 4 by EMT student Josh, esophageal intubation. Ett removed, DL by CRNA with MAC 4, grade I view, ett easily passed

## 2022-08-12 NOTE — H&P (View-Only) (Signed)
Ortho Trauma Note  Patient has been discussed with Dr. Charlann Boxer at length.  He has a complex proximal femur fracture that requires intramedullary nailing of his right femur.  Risks and benefits were discussed with the patient.  Risk include but not limited to bleeding, infection, malunion, nonunion, hardware failure, hardware irritation, nerve and blood vessel injury, DVT, and the possible anesthetic complications.  He agrees to proceed with surgery and consent was obtained.  He is cases complicated by his recent stent for which he requires antiplatelet therapy.  We will plan to likely restart the Brilinta post operative either tonight or tomorrow morning depending on his intraoperative blood loss.  Roby Lofts, MD [facility] [phone] (office) [email or web address]

## 2022-08-12 NOTE — Op Note (Signed)
Orthopaedic Surgery Operative Note (CSN: [id number] ) Date of Surgery: 08/12/2022  Admit Date: 08/10/2022   Diagnoses: Pre-Op Diagnoses: Right intertrochanteric/subtrochanteric femur fracture  Post-Op Diagnosis: Same  Procedures: CPT 27506-Cephalomedullary nailing of right intertrochanteric/subtrochanteric femur fracture  Surgeons : Primary: Roby Lofts, MD  Assistant: Ulyses Southward, PA-C  Location: OR 3   Anesthesia: General   Antibiotics: Ancef 2g preop   Tourniquet time: None    Estimated Blood Loss: 250 mL  Complications:None   Specimens:None   Implants: Implant Name Type Inv. Item Serial No. Manufacturer Lot No. LRB No. Used Action  NAIL LOCK CANN 10X380 130D RT - [id number] Nail NAIL LOCK CANN 10X380 130D RT  [employer] [id number] Right 1 Implanted  SCREW LAG COMPR KIT 100/95 - [id number] Screw SCREW LAG COMPR KIT 100/95  [facility] [employer] [id number] Right 1 Implanted  SCREW TRIGEN LOW PROF 5.0X45 - [id number] Screw SCREW TRIGEN LOW PROF 5.0X45  [employer] [id number] Right 1 Implanted  SCREW TRIGEN LOW PROF 5.0X42.5 - [id number] Screw SCREW TRIGEN LOW PROF 5.0X42.5  [employer] [id number] Right 1 Implanted     Indications for Surgery: 77 year old male who fell from a roof sustaining a right subtrochanteric/intertrochanteric femur fracture.  Patient has a recent stent of his heart and was on antiplatelet therapy.  Due to the unstable nature of his injury I recommended proceeding with cephalomedullary nailing of his right hip.  Risks and benefits were discussed with the patient.  Risk included but not limited to bleeding, infection, malunion, nonunion, hardware failure, hardware irritation, nerve blood vessel injury, DVT, and the possibility anesthetic complications.  He agreed to proceed with surgery and consent was obtained.  Operative Findings: Cephalomedullary nailing of right  intertrochanteric/subtrochanteric femur fracture using [employer] InterTAN 10 x 380 mm nail with a 100 mm lag screw 95 mm compression screw.  Procedure: The patient was identified in the preoperative holding area. Consent was confirmed with the patient and their family and all questions were answered. The operative extremity was marked after confirmation with the patient. he was then brought back to the operating room by our anesthesia colleagues.  He was placed under general anesthetic and carefully transferred over to a traction table.  Traction was applied to the right lower extremity in decent alignment was obtained.  The right lower extremity was then prepped and draped in usual sterile fashion.  A timeout was performed to verify the patient, the procedure, and the extremity.  Preoperative antibiotics were dosed.  A small incision proximal to the greater trochanter was made and carried down through skin and subcutaneous tissue.  A threaded guidewire was placed at the tip of the greater trochanter and advanced into the proximal metaphysis.  I confirmed positioning with fluoroscopy and then used and then treatment to enter the medullary canal.  I then attempted to pass a ball-tipped guidewire down the central canal however the spiral nature of his proximal femur fracture prevented the guidewire from entering the center of the canal.  I then made a percutaneous incision and used a Cobb elevator to elevate the intercalary fragment into reduction to allow for use of an awl to guide the guidewire down the center of the canal and seated it into the distal metaphysis.  I then measured the length and chose to use a 380 mm nail.  At this point I sequentially reamed from 9 mm to 11.5 mm.  I then placed a 10 x 380 mm nail  down the center of the canal attached to the targeting arm.  There was a slight amount of displacement of the lateral portion of the proximal fragment.  I made another percutaneous incision and  used a ball spike pusher to help reduce this and then placed a threaded guidewire using the targeting arm up into the head/neck segment.  I confirmed adequate tip apex distance and then measured and chose to use 100 mm lag screw.  I then drilled the path for the compression screw placed an antirotation bar.  I then drilled the path for the lag screw and placed the lag screw.  I then compressed approximately 3 to 5 cm through the nail and statically locked the proximal portion of the nail.  I removed the targeting arm and then used perfect circle technique to place 2 lateral to medial distal locking screws.  Final fluoroscopic imaging was obtained.  The incisions were copiously irrigated.  A layered closure of 2-0 Monocryl and 3-0 Monocryl with Dermabond was used to close the skin.  Sterile dressings were applied.  The patient was then awoke from anesthesia and taken to the PACU in stable condition.  Post Op Plan/Instructions: The patient will be weightbearing as tolerated to the right lower extremity.  He will receive postoperative Ancef.  He will be placed on his antiplatelet therapies.  He may start his Brilinta tomorrow morning.  We will have him mobilize with physical and Occupational Therapy.  I was present and performed the entire surgery.  Ulyses Southward, PA-C did assist me throughout the case. An assistant was necessary given the difficulty in approach, maintenance of reduction and ability to instrument the fracture.   Truitt Merle, MD [facility]

## 2022-08-12 NOTE — Progress Notes (Signed)
Rounding Note    Patient Name: Gregory Gibbs Date of Encounter: 08/12/2022  [facility] Cardiologist: Gypsy Balsam, MD   Subjective   Denies any chest pain or dyspnea  Inpatient Medications    Scheduled Meds:  acetaminophen  650 mg Oral Q6H   aspirin  81 mg Oral Daily   atorvastatin  80 mg Oral Daily   docusate sodium  100 mg Oral BID   fentaNYL       ferrous sulfate  325 mg Oral TID PC   levothyroxine  50 mcg Oral q morning   oxyCODONE       pantoprazole  40 mg Oral Daily   Continuous Infusions:  0.9 % NaCl with KCl 20 mEq / L 100 mL/hr at 08/12/22 0415   ceFAZolin      ceFAZolin (ANCEF) IV     methocarbamol (ROBAXIN) IV     PRN Meds: ceFAZolin, fentaNYL, HYDROmorphone (DILAUDID) injection, methocarbamol **OR** methocarbamol (ROBAXIN) IV, metoCLOPramide **OR** metoCLOPramide (REGLAN) injection, morphine injection, ondansetron **OR** ondansetron (ZOFRAN) IV, oxyCODONE, oxyCODONE, polyethylene glycol   Vital Signs    Vitals:   08/12/22 0941 08/12/22 0945 08/12/22 1000 08/12/22 1015  BP: 124/60 (!) 112/52 (!) 117/57 113/60  Pulse: 80 79 66 62  Resp:  19 12 14   Temp: 98 F (36.7 C)  97.6 F (36.4 C)   TempSrc:      SpO2: 100% 100% 98% 98%  Weight:      Height:        Intake/Output Summary (Last 24 hours) at 08/12/2022 1036 Last data filed at 08/12/2022 0949 Gross per 24 hour  Intake 3858.59 ml  Output 900 ml  Net 2958.59 ml      08/10/2022    3:53 PM 08/10/2022    3:44 PM 06/29/2022    2:49 PM  Last 3 Weights  Weight (lbs) 149 lb 14.6 oz 149 lb 14.6 oz 152 lb  Weight (kg) 68 kg 68 kg 68.947 kg      Telemetry    NSR - Personally Reviewed  ECG    No new ECG - Personally Reviewed  Physical Exam   GEN: No acute distress.   Neck: No JVD Cardiac: RRR, no murmurs, rubs, or gallops.  Respiratory: Clear to auscultation bilaterally. GI: Soft, nontender, non-distended  MS: No edema Neuro:  Nonfocal  Psych: Normal affect   Labs     High Sensitivity Troponin:  No results for input(s): "TROPONINIHS" in the last 720 hours.   Chemistry Recent Labs  Lab 08/10/22 1539 08/10/22 1614 08/10/22 2338 08/11/22 0323 08/12/22 0011  NA 138   < > 139 138 137  K 3.0*   < > 4.4 4.5 4.4  CL 112*   < > 105 109 107  CO2 19*  --   --  21* 21*  GLUCOSE 108*   < > 117* 129* 126*  BUN 9   < > 13 12 15   CREATININE 1.14   < > 1.20 1.38* 1.20  CALCIUM 6.6*  --   --  8.1* 7.8*  PROT 4.4*  --   --   --   --   ALBUMIN 2.7*  --   --   --   --   AST 36  --   --   --   --   ALT 25  --   --   --   --   ALKPHOS 49  --   --   --   --  BILITOT 1.2  --   --   --   --   GFRNONAA >60  --   --  53* >60  ANIONGAP 7  --   --  8 9   < > = values in this interval not displayed.    Lipids No results for input(s): "CHOL", "TRIG", "HDL", "LABVLDL", "LDLCALC", "CHOLHDL" in the last 168 hours.  Hematology Recent Labs  Lab 08/10/22 1539 08/10/22 1614 08/10/22 2338 08/11/22 0603 08/12/22 0011  WBC 15.4*  --   --  14.0* 16.5*  RBC 3.56*  --   --  3.32* 2.64*  HGB 10.6*   < > 9.9* 9.7* 7.7*  HCT 31.6*   < > 29.0* 29.0* 23.3*  MCV 88.8  --   --  87.3 88.3  MCH 29.8  --   --  29.2 29.2  MCHC 33.5  --   --  33.4 33.0  RDW 12.0  --   --  12.2 12.4  PLT 192  --   --  161 145*   < > = values in this interval not displayed.   Thyroid No results for input(s): "TSH", "FREET4" in the last 168 hours.  BNPNo results for input(s): "BNP", "PROBNP" in the last 168 hours.  DDimer No results for input(s): "DDIMER" in the last 168 hours.   Radiology    DG C-Arm 1-60 Min-No Report  Result Date: 08/12/2022 Fluoroscopy was utilized by the requesting physician.  No radiographic interpretation.   DG C-Arm 1-60 Min-No Report  Result Date: 08/12/2022 Fluoroscopy was utilized by the requesting physician.  No radiographic interpretation.   DG C-Arm 1-60 Min-No Report  Result Date: 08/11/2022 Fluoroscopy was utilized by the requesting physician.  No radiographic  interpretation.   CT Head Wo Contrast  Result Date: 08/10/2022 CLINICAL DATA:  Head trauma, minor (Age >= 65y) Fall off roof cleaning gutters. EXAM: CT HEAD WITHOUT CONTRAST TECHNIQUE: Contiguous axial images were obtained from the base of the skull through the vertex without intravenous contrast. RADIATION DOSE REDUCTION: This exam was performed according to the departmental dose-optimization program which includes automated exposure control, adjustment of the mA and/or kV according to patient size and/or use of iterative reconstruction technique. COMPARISON:  None Available. FINDINGS: Brain: No intracranial hemorrhage, mass effect, or midline shift. No hydrocephalus. The basilar cisterns are patent. No evidence of territorial infarct or acute ischemia. No extra-axial or intracranial fluid collection. Vascular: No hyperdense vessel or unexpected calcification. Skull: No fracture or focal lesion. Sinuses/Orbits: Trace opacification of lower left mastoid air cells. The paranasal sinuses are clear Other: None. IMPRESSION: No acute intracranial abnormality. No skull fracture. Electronically Signed   By: Narda Rutherford M.D.   On: 08/10/2022 17:56   CT Cervical Spine Wo Contrast  Result Date: 08/10/2022 CLINICAL DATA:  Neck trauma (Age >= 65y) Fall from roof while cleaning gutters. EXAM: CT CERVICAL SPINE WITHOUT CONTRAST TECHNIQUE: Multidetector CT imaging of the cervical spine was performed without intravenous contrast. Multiplanar CT image reconstructions were also generated. RADIATION DOSE REDUCTION: This exam was performed according to the departmental dose-optimization program which includes automated exposure control, adjustment of the mA and/or kV according to patient size and/or use of iterative reconstruction technique. COMPARISON:  None Available. FINDINGS: Alignment: Normal. Skull base and vertebrae: No acute fracture. Vertebral body heights are maintained. The dens and skull base are intact. Soft  tissues and spinal canal: No prevertebral fluid or swelling. No visible canal hematoma. Disc levels: Disc space narrowing and spurring at  C5-C6 with adjacent Modic endplate changes. Upper chest: No acute apical findings, assessed fully on concurrent chest CT, reported separately. Other: None. IMPRESSION: Mild degenerative change in the cervical spine without acute fracture or subluxation. Electronically Signed   By: Narda Rutherford M.D.   On: 08/10/2022 17:52   CT CHEST ABDOMEN PELVIS W CONTRAST  Result Date: 08/10/2022 CLINICAL DATA:  Fall from roof cleaning gutters. EXAM: CT CHEST, ABDOMEN, AND PELVIS WITH CONTRAST TECHNIQUE: Multidetector CT imaging of the chest, abdomen and pelvis was performed following the standard protocol during bolus administration of intravenous contrast. RADIATION DOSE REDUCTION: This exam was performed according to the departmental dose-optimization program which includes automated exposure control, adjustment of the mA and/or kV according to patient size and/or use of iterative reconstruction technique. CONTRAST:  75mL OMNIPAQUE IOHEXOL 350 MG/ML SOLN COMPARISON:  None Available. FINDINGS: CT CHEST FINDINGS Cardiovascular: No aortic injury. No mediastinal hematoma. Great vessels normal. Mediastinum/Nodes: Trachea and esophagus normal. Lungs/Pleura: No pneumothorax.  No pulmonary contusion. 7 mm lingular pulmonary nodule.  (Image 101/series 4) Musculoskeletal: No fracture CT ABDOMEN AND PELVIS FINDINGS Hepatobiliary: No hepatic laceration. Pancreas: Normal pancreas. Spleen: No splenic laceration. Adrenals/urinary tract: Adrenal glands normal. Kidneys enhance symmetrically. Bladder intact. Stomach/Bowel: No bowel injury identified.  No mesenteric fluid. Vascular/Lymphatic: Abdominal aorta normal caliber. Iliac arteries normal. Reproductive: Other: No free fluid. Musculoskeletal: Comminuted fracture of the proximal RIGHT femur diaphysis and extending into intertrochanteric femur. The  femoral head is located and varus angulated. No acetabular fracture identified. Intramuscular hematoma surrounds the comminuted femur fracture on the RIGHT. No evidence of active arterial bleeding. No pelvic fracture or sacral fracture identified. IMPRESSION: CHEST: 1. No thoracic injury. 2. Left solid pulmonary nodule measuring 7 mm. Per Fleischner Society Guidelines, recommend a non-contrast Chest CT at 6-12 months. If patient is high risk for malignancy, consider an additional non-contrast Chest CT at 18-24 months. If patient is low risk for malignancy, non-contrast Chest CT at 18-24 months is optional. These guidelines do not apply to immunocompromised patients and patients with cancer. Follow up in patients with significant comorbidities as clinically warranted. For lung cancer screening, adhere to Lung-RADS guidelines. Reference: Radiology. 2017; 284(1):228-43. PELVIS: 1. Comminuted fracture of the proximal RIGHT femur diaphysis and extending into the intertrochanteric femur. Femoral head is located and varus angulated. 2. Large intramuscular hematoma surrounds the proximal RIGHT femur fracture. 3. No evidence of solid organ injury. Electronically Signed   By: Genevive Bi M.D.   On: 08/10/2022 17:47   DG Femur Min 2 Views Right  Result Date: 08/10/2022 CLINICAL DATA:  Right hip pain after fall off roof. EXAM: RIGHT FEMUR 2 VIEWS COMPARISON:  None Available. FINDINGS: Severely displaced and comminuted fracture is seen involving the intertrochanteric region and proximal shaft of the right femur. IMPRESSION: Severely displaced and comminuted proximal right femoral fracture as noted above. Electronically Signed   By: Lupita Raider M.D.   On: 08/10/2022 16:19   DG Chest Portable 1 View  Result Date: 08/10/2022 CLINICAL DATA:  Fall. EXAM: PORTABLE CHEST 1 VIEW COMPARISON:  Jun 02, 2022. FINDINGS: The heart size and mediastinal contours are within normal limits. Both lungs are clear. The visualized  skeletal structures are unremarkable. IMPRESSION: No active disease. Electronically Signed   By: Lupita Raider M.D.   On: 08/10/2022 16:18   DG Pelvis Portable  Result Date: 08/10/2022 CLINICAL DATA:  Right hip pain after fall off roof. EXAM: PORTABLE PELVIS 1-2 VIEWS COMPARISON:  None Available. FINDINGS: Severely displaced  and comminuted fracture is seen involving the intertrochanteric region and proximal shaft of the right femur. IMPRESSION: Severely displaced and comminuted proximal right femoral fracture as noted above. Electronically Signed   By: Lupita Raider M.D.   On: 08/10/2022 16:17    Cardiac Studies     Patient Profile     77 y.o. male with a history of CAD, chronic systolic heart failure, hyperlipidemia, orthostatic hypotension, CKD stage III, hypothyroidism who are consulted for evaluation of CAD/preop evaluation   Assessment & Plan    R hip fracture: occurred after falling off room while attempting to clean gutter.  Underwent surgery this morning. -Continue ASA 81 mg daily -Per Dr Jena Gauss, OK to restart Brilinta tomorrow morning   CAD s/p recent DES to prox LAD 06/03/22.  Will need to minimize time off DAPT given recent coronary stent.  Continuing aspirin 80 mg preoperatively.  Plan to restart Brilinta tomorrow morning per Dr. Jena Gauss  H/o syncope: patient had bradycardia down to 30s and positive orthostatic hypotension that lead to syncope in May, beta blocker removed at the time. 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 Euvolemic on exam   Hyperlipidemia: continue lipitor 80mg  daily   CKD stage III: Cr at baseline  Anemia: Hgb 7.7 this morning, likely blood loss anemia due to surgery.  Transfused 1 units PRBCs  For questions or updates, please contact [facility] Please consult [email or web address] for contact info under        Signed, Little Ishikawa, MD  08/12/2022, 10:36 AM

## 2022-08-12 NOTE — Plan of Care (Signed)

## 2022-08-12 NOTE — Progress Notes (Signed)
PROGRESS NOTE    Gregory Gibbs  JXB:[id number] DOB: 01-01-1946 DOA: 08/10/2022 PCP: Hadley Pen, MD   Brief Narrative:  77 y.o. male with past medical history of CAD with recent stenting few months ago, hypothyroidism, and GERD presented with a mechanical fall and was found to have comminuted fracture of the proximal right femur and was admitted under orthopedic service.  He underwent closed reduction of femur fracture with placement of lower extremity traction pin on 08/10/2022.  TRH was consulted on 08/11/2022 for medical management.  Assessment & Plan:   Closed comminuted fracture of the proximal right femur after a mechanical fall -Management as per primary orthopedics team. -underwent closed reduction of femur fracture with placement of lower extremity traction pin on 08/10/2022.  -Underwent cephalomedullary nailing of right intertrochanteric/subtrochanteric femur fracture today by orthopedics.  Leukocytosis -Possibly reactive.  Monitor.  No signs of infection at this time  Thrombocytopenia -Questionable cause.  Monitor intermittently.  Normocytic anemia/possible acute blood loss anemia -Possibly from femur fracture.  Monitor hemoglobin.  Transfuse if hemoglobin is less than 7.  CAD status post recent LAD stenting Chronic systolic heart failure Hypertension Hyperlipidemia -Cardiology following.  Currently on aspirin and statin.  Brilinta on hold.  Hypothyroidism -Continue levothyroxine  Subjective: Patient seen and examined at bedside in PACU.  Slow to respond.  No fever, seizures, vomiting or agitation reported.  Objective: Vitals:   08/12/22 0448 08/12/22 0941 08/12/22 0945 08/12/22 1000  BP: 117/67 124/60 (!) 112/52 (!) 117/57  Pulse: 78 80 79 66  Resp:   19 12  Temp: 98.7 F (37.1 C) 98 F (36.7 C)  97.6 F (36.4 C)  TempSrc: Oral     SpO2: 100% 100% 100% 98%  Weight:      Height:        Intake/Output Summary (Last 24 hours) at 08/12/2022 1021 Last data  filed at 08/12/2022 0949 Gross per 24 hour  Intake 3858.59 ml  Output 900 ml  Net 2958.59 ml   Filed Weights   08/10/22 1544 08/10/22 1553  Weight: 68 kg 68 kg    Examination:  General exam: Appears calm and comfortable.  On room air. Respiratory system: Bilateral decreased breath sounds at bases with scattered crackles Cardiovascular system: S1 & S2 heard, Rate controlled Gastrointestinal system: Abdomen is nondistended, soft and nontender. Normal bowel sounds heard. Extremities: No cyanosis, clubbing, edema  Central nervous system: Alert, slow to respond.  No focal neurological deficits. Moving extremities Skin: No rashes, lesions or ulcers Psychiatry: Flat affect.  Not agitated.    Data Reviewed: I have personally reviewed following labs and imaging studies  CBC: Recent Labs  Lab 08/10/22 1539 08/10/22 1614 08/10/22 2338 08/11/22 0603 08/12/22 0011  WBC 15.4*  --   --  14.0* 16.5*  NEUTROABS 11.7*  --   --   --   --   HGB 10.6* 9.9* 9.9* 9.7* 7.7*  HCT 31.6* 29.0* 29.0* 29.0* 23.3*  MCV 88.8  --   --  87.3 88.3  PLT 192  --   --  161 145*   Basic Metabolic Panel: Recent Labs  Lab 08/10/22 1539 08/10/22 1614 08/10/22 2338 08/11/22 0323 08/12/22 0011  NA 138 140 139 138 137  K 3.0* 3.6 4.4 4.5 4.4  CL 112* 107 105 109 107  CO2 19*  --   --  21* 21*  GLUCOSE 108* 119* 117* 129* 126*  BUN 9 10 13 12 15   CREATININE 1.14 1.30* 1.20 1.38* 1.20  CALCIUM 6.6*  --   --  8.1* 7.8*   GFR: Estimated Creatinine Clearance: 51.2 mL/min (by C-G formula based on SCr of 1.2 mg/dL). Liver Function Tests: Recent Labs  Lab 08/10/22 1539  AST 36  ALT 25  ALKPHOS 49  BILITOT 1.2  PROT 4.4*  ALBUMIN 2.7*   No results for input(s): "LIPASE", "AMYLASE" in the last 168 hours. No results for input(s): "AMMONIA" in the last 168 hours. Coagulation Profile: No results for input(s): "INR", "PROTIME" in the last 168 hours. Cardiac Enzymes: No results for input(s):  "CKTOTAL", "CKMB", "CKMBINDEX", "TROPONINI" in the last 168 hours. BNP (last 3 results) No results for input(s): "PROBNP" in the last 8760 hours. HbA1C: No results for input(s): "HGBA1C" in the last 72 hours. CBG: No results for input(s): "GLUCAP" in the last 168 hours. Lipid Profile: No results for input(s): "CHOL", "HDL", "LDLCALC", "TRIG", "CHOLHDL", "LDLDIRECT" in the last 72 hours. Thyroid Function Tests: No results for input(s): "TSH", "T4TOTAL", "FREET4", "T3FREE", "THYROIDAB" in the last 72 hours. Anemia Panel: No results for input(s): "VITAMINB12", "FOLATE", "FERRITIN", "TIBC", "IRON", "RETICCTPCT" in the last 72 hours. Sepsis Labs: No results for input(s): "PROCALCITON", "LATICACIDVEN" in the last 168 hours.  Recent Results (from the past 240 hour(s))  Surgical pcr screen     Status: None   Collection Time: 08/10/22  5:20 PM   Specimen: Nasal Mucosa; Nasal Swab  Result Value Ref Range Status   MRSA, PCR NEGATIVE NEGATIVE Final   Staphylococcus aureus NEGATIVE NEGATIVE Final    Comment: (NOTE) The Xpert SA Assay (FDA approved for NASAL specimens in patients 6 years of age and older), is one component of a comprehensive surveillance program. It is not intended to diagnose infection nor to guide or monitor treatment. Performed at [facility], [address], [place], [place] [address]          Radiology Studies: DG C-Arm 1-60 Min-No Report  Result Date: 08/12/2022 Fluoroscopy was utilized by the requesting physician.  No radiographic interpretation.   DG C-Arm 1-60 Min-No Report  Result Date: 08/12/2022 Fluoroscopy was utilized by the requesting physician.  No radiographic interpretation.   DG C-Arm 1-60 Min-No Report  Result Date: 08/11/2022 Fluoroscopy was utilized by the requesting physician.  No radiographic interpretation.   CT Head Wo Contrast  Result Date: 08/10/2022 CLINICAL DATA:  Head trauma, minor (Age >= 65y) Fall off roof cleaning gutters.  EXAM: CT HEAD WITHOUT CONTRAST TECHNIQUE: Contiguous axial images were obtained from the base of the skull through the vertex without intravenous contrast. RADIATION DOSE REDUCTION: This exam was performed according to the departmental dose-optimization program which includes automated exposure control, adjustment of the mA and/or kV according to patient size and/or use of iterative reconstruction technique. COMPARISON:  None Available. FINDINGS: Brain: No intracranial hemorrhage, mass effect, or midline shift. No hydrocephalus. The basilar cisterns are patent. No evidence of territorial infarct or acute ischemia. No extra-axial or intracranial fluid collection. Vascular: No hyperdense vessel or unexpected calcification. Skull: No fracture or focal lesion. Sinuses/Orbits: Trace opacification of lower left mastoid air cells. The paranasal sinuses are clear Other: None. IMPRESSION: No acute intracranial abnormality. No skull fracture. Electronically Signed   By: Narda Rutherford M.D.   On: 08/10/2022 17:56   CT Cervical Spine Wo Contrast  Result Date: 08/10/2022 CLINICAL DATA:  Neck trauma (Age >= 65y) Fall from roof while cleaning gutters. EXAM: CT CERVICAL SPINE WITHOUT CONTRAST TECHNIQUE: Multidetector CT imaging of the cervical spine was performed without  intravenous contrast. Multiplanar CT image reconstructions were also generated. RADIATION DOSE REDUCTION: This exam was performed according to the departmental dose-optimization program which includes automated exposure control, adjustment of the mA and/or kV according to patient size and/or use of iterative reconstruction technique. COMPARISON:  None Available. FINDINGS: Alignment: Normal. Skull base and vertebrae: No acute fracture. Vertebral body heights are maintained. The dens and skull base are intact. Soft tissues and spinal canal: No prevertebral fluid or swelling. No visible canal hematoma. Disc levels: Disc space narrowing and spurring at C5-C6 with  adjacent Modic endplate changes. Upper chest: No acute apical findings, assessed fully on concurrent chest CT, reported separately. Other: None. IMPRESSION: Mild degenerative change in the cervical spine without acute fracture or subluxation. Electronically Signed   By: Narda Rutherford M.D.   On: 08/10/2022 17:52   CT CHEST ABDOMEN PELVIS W CONTRAST  Result Date: 08/10/2022 CLINICAL DATA:  Fall from roof cleaning gutters. EXAM: CT CHEST, ABDOMEN, AND PELVIS WITH CONTRAST TECHNIQUE: Multidetector CT imaging of the chest, abdomen and pelvis was performed following the standard protocol during bolus administration of intravenous contrast. RADIATION DOSE REDUCTION: This exam was performed according to the departmental dose-optimization program which includes automated exposure control, adjustment of the mA and/or kV according to patient size and/or use of iterative reconstruction technique. CONTRAST:  75mL OMNIPAQUE IOHEXOL 350 MG/ML SOLN COMPARISON:  None Available. FINDINGS: CT CHEST FINDINGS Cardiovascular: No aortic injury. No mediastinal hematoma. Great vessels normal. Mediastinum/Nodes: Trachea and esophagus normal. Lungs/Pleura: No pneumothorax.  No pulmonary contusion. 7 mm lingular pulmonary nodule.  (Image 101/series 4) Musculoskeletal: No fracture CT ABDOMEN AND PELVIS FINDINGS Hepatobiliary: No hepatic laceration. Pancreas: Normal pancreas. Spleen: No splenic laceration. Adrenals/urinary tract: Adrenal glands normal. Kidneys enhance symmetrically. Bladder intact. Stomach/Bowel: No bowel injury identified.  No mesenteric fluid. Vascular/Lymphatic: Abdominal aorta normal caliber. Iliac arteries normal. Reproductive: Other: No free fluid. Musculoskeletal: Comminuted fracture of the proximal RIGHT femur diaphysis and extending into intertrochanteric femur. The femoral head is located and varus angulated. No acetabular fracture identified. Intramuscular hematoma surrounds the comminuted femur fracture on the  RIGHT. No evidence of active arterial bleeding. No pelvic fracture or sacral fracture identified. IMPRESSION: CHEST: 1. No thoracic injury. 2. Left solid pulmonary nodule measuring 7 mm. Per Fleischner Society Guidelines, recommend a non-contrast Chest CT at 6-12 months. If patient is high risk for malignancy, consider an additional non-contrast Chest CT at 18-24 months. If patient is low risk for malignancy, non-contrast Chest CT at 18-24 months is optional. These guidelines do not apply to immunocompromised patients and patients with cancer. Follow up in patients with significant comorbidities as clinically warranted. For lung cancer screening, adhere to Lung-RADS guidelines. Reference: Radiology. 2017; 284(1):228-43. PELVIS: 1. Comminuted fracture of the proximal RIGHT femur diaphysis and extending into the intertrochanteric femur. Femoral head is located and varus angulated. 2. Large intramuscular hematoma surrounds the proximal RIGHT femur fracture. 3. No evidence of solid organ injury. Electronically Signed   By: Genevive Bi M.D.   On: 08/10/2022 17:47   DG Femur Min 2 Views Right  Result Date: 08/10/2022 CLINICAL DATA:  Right hip pain after fall off roof. EXAM: RIGHT FEMUR 2 VIEWS COMPARISON:  None Available. FINDINGS: Severely displaced and comminuted fracture is seen involving the intertrochanteric region and proximal shaft of the right femur. IMPRESSION: Severely displaced and comminuted proximal right femoral fracture as noted above. Electronically Signed   By: Lupita Raider M.D.   On: 08/10/2022 16:19   DG Chest Portable  1 View  Result Date: 08/10/2022 CLINICAL DATA:  Fall. EXAM: PORTABLE CHEST 1 VIEW COMPARISON:  Jun 02, 2022. FINDINGS: The heart size and mediastinal contours are within normal limits. Both lungs are clear. The visualized skeletal structures are unremarkable. IMPRESSION: No active disease. Electronically Signed   By: Lupita Raider M.D.   On: 08/10/2022 16:18   DG Pelvis  Portable  Result Date: 08/10/2022 CLINICAL DATA:  Right hip pain after fall off roof. EXAM: PORTABLE PELVIS 1-2 VIEWS COMPARISON:  None Available. FINDINGS: Severely displaced and comminuted fracture is seen involving the intertrochanteric region and proximal shaft of the right femur. IMPRESSION: Severely displaced and comminuted proximal right femoral fracture as noted above. Electronically Signed   By: Lupita Raider M.D.   On: 08/10/2022 16:17        Scheduled Meds:  [MAR Hold] aspirin  81 mg Oral Daily   [MAR Hold] atorvastatin  80 mg Oral Daily   [MAR Hold] docusate sodium  100 mg Oral BID   fentaNYL       [MAR Hold] ferrous sulfate  325 mg Oral TID PC   [MAR Hold] levothyroxine  50 mcg Oral q morning   oxyCODONE       [MAR Hold] pantoprazole  40 mg Oral Daily   Continuous Infusions:  0.9 % NaCl with KCl 20 mEq / L 100 mL/hr at 08/12/22 0415   ceFAZolin     lactated ringers 10 mL/hr at 08/12/22 0734   [MAR Hold] methocarbamol (ROBAXIN) IV            Glade Lloyd, MD [facility] 08/12/2022, 10:21 AM

## 2022-08-12 NOTE — Interval H&P Note (Signed)
History and Physical Interval Note:  08/12/2022 7:35 AM  Gregory Gibbs  has presented today for surgery, with the diagnosis of Right proximal femur fracture.  The various methods of treatment have been discussed with the patient and family. After consideration of risks, benefits and other options for treatment, the patient has consented to  Procedure(s): INTRAMEDULLARY (IM) NAIL INTERTROCHANTERIC (Right) as a surgical intervention.  The patient's history has been reviewed, patient examined, no change in status, stable for surgery.  I have reviewed the patient's chart and labs.  Questions were answered to the patient's satisfaction.     [name] 

## 2022-08-12 NOTE — Anesthesia Postprocedure Evaluation (Signed)
Anesthesia Post Note  Patient: Gregory Gibbs  Procedure(s) Performed: INTRAMEDULLARY (IM) NAIL INTERTROCHANTERIC (Right)     Patient location during evaluation: PACU Anesthesia Type: General Level of consciousness: awake Pain management: pain level controlled Vital Signs Assessment: post-procedure vital signs reviewed and stable Respiratory status: spontaneous breathing, nonlabored ventilation and respiratory function stable Cardiovascular status: blood pressure returned to baseline and stable Postop Assessment: no apparent nausea or vomiting Anesthetic complications: no   No notable events documented.  Last Vitals:  Vitals:   08/12/22 1000 08/12/22 1015  BP: (!) 117/57 113/60  Pulse: 66 62  Resp: 12 14  Temp: 36.4 C   SpO2: 98% 98%    Last Pain:  Vitals:   08/12/22 1126  TempSrc:   PainSc: Asleep                 Linton Rump

## 2022-08-12 NOTE — Transfer of Care (Signed)
Immediate Anesthesia Transfer of Care Note  Patient: Gregory Gibbs  Procedure(s) Performed: INTRAMEDULLARY (IM) NAIL INTERTROCHANTERIC (Right)  Patient Location: PACU  Anesthesia Type:General  Level of Consciousness: drowsy and patient cooperative  Airway & Oxygen Therapy: Patient Spontanous Breathing and Patient connected to face mask oxygen  Post-op Assessment: Report given to RN and Post -op Vital signs reviewed and stable  Post vital signs: Reviewed and stable  Last Vitals:  Vitals Value Taken Time  BP 112/52 08/12/22 0945  Temp    Pulse 70 08/12/22 0949  Resp 13 08/12/22 0949  SpO2 98 % 08/12/22 0949  Vitals shown include unfiled device data.  Last Pain:  Vitals:   08/12/22 0730  TempSrc:   PainSc: 0-No pain         Complications: No notable events documented.

## 2022-08-12 NOTE — Progress Notes (Signed)
Ortho Trauma Note  Patient has been discussed with Dr. Charlann Boxer at length.  He has a complex proximal femur fracture that requires intramedullary nailing of his right femur.  Risks and benefits were discussed with the patient.  Risk include but not limited to bleeding, infection, malunion, nonunion, hardware failure, hardware irritation, nerve and blood vessel injury, DVT, and the possible anesthetic complications.  He agrees to proceed with surgery and consent was obtained.  He is cases complicated by his recent stent for which he requires antiplatelet therapy.  We will plan to likely restart the Brilinta post operative either tonight or tomorrow morning depending on his intraoperative blood loss.  Roby Lofts, MD [facility] [phone] (office) [email or web address]

## 2022-08-13 DIAGNOSIS — D649 Anemia, unspecified: Secondary | ICD-10-CM | POA: Diagnosis not present

## 2022-08-13 DIAGNOSIS — D72829 Elevated white blood cell count, unspecified: Secondary | ICD-10-CM | POA: Diagnosis not present

## 2022-08-13 DIAGNOSIS — I2510 Atherosclerotic heart disease of native coronary artery without angina pectoris: Secondary | ICD-10-CM | POA: Diagnosis not present

## 2022-08-13 DIAGNOSIS — S7291XA Unspecified fracture of right femur, initial encounter for closed fracture: Secondary | ICD-10-CM | POA: Diagnosis not present

## 2022-08-13 LAB — PREPARE RBC (CROSSMATCH)

## 2022-08-13 LAB — HEMOGLOBIN AND HEMATOCRIT, BLOOD
HCT: 24.8 % — ABNORMAL LOW (ref 39.0–52.0)
Hemoglobin: 8.5 g/dL — ABNORMAL LOW (ref 13.0–17.0)

## 2022-08-13 MED ORDER — SODIUM CHLORIDE 0.9% IV SOLUTION: Freq: Once | INTRAVENOUS | Status: AC

## 2022-08-13 NOTE — Progress Notes (Addendum)
PROGRESS NOTE    Gregory Gibbs  YQM:[id number] DOB: [birth year] DOA: 08/10/2022 PCP: Hadley Pen, MD   Brief Narrative:  77 y.o. male with past medical history of CAD with recent stenting few months ago, hypothyroidism, and GERD presented with a mechanical fall and was found to have comminuted fracture of the proximal right femur and was admitted under orthopedic service.  He underwent closed reduction of femur fracture with placement of lower extremity traction pin on 08/10/2022.  TRH was consulted on 08/11/2022 for medical management.  Subsequently, care was transferred to [facility] service.  Assessment & Plan:   Closed comminuted fracture of the proximal right femur after a mechanical fall -Management as per orthopedics team. -underwent closed reduction of femur fracture with placement of lower extremity traction pin on 08/10/2022.  -Underwent cephalomedullary nailing of right intertrochanteric/subtrochanteric femur fracture today by orthopedics. -Wound care/pain management as per orthopedics.  Leukocytosis -Possibly reactive.  Improving.  Monitor.  No signs of infection at this time  Thrombocytopenia -Questionable cause.  Monitor intermittently.  Normocytic anemia/possible acute blood loss anemia -Possibly from femur fracture.  Hemoglobin 6.7 today.  Transfuse 1 unit packed red cells.  Monitor H&H.    CAD status post recent LAD stenting Chronic systolic heart failure Hypertension Hyperlipidemia -Cardiology following.  Currently on aspirin and statin.  Brilinta to be resumed once cleared by orthopedics.  CKD stage IIIa -Creatinine currently stable.  Monitor intermittently.  Hypothyroidism -Continue levothyroxine  Subjective: Patient seen and examined at bedside.  No chest pain, shortness of breath, fever reported.  Complains of right hip pain intermittently. Objective: Vitals:   08/13/22 0325 08/13/22 0403 08/13/22 0455 08/13/22 0620  BP: (!) 118/57 (!) 107/55  (!) 114/59   Pulse: 75 69  77  Resp: 16 16    Temp: 98.3 F (36.8 C) 98 F (36.7 C)  98.8 F (37.1 C)  TempSrc: Oral   Oral  SpO2: 97% 98%  96%  Weight:   68.3 kg   Height:        Intake/Output Summary (Last 24 hours) at 08/13/2022 0825 Last data filed at 08/13/2022 0732 Gross per 24 hour  Intake 2759.11 ml  Output 500 ml  Net 2259.11 ml   Filed Weights   08/10/22 1544 08/10/22 1553 08/13/22 0455  Weight: 68 kg 68 kg 68.3 kg    Examination:  General: On room air.  No distress.  Chronically ill and deconditioned. ENT/neck: No thyromegaly.  JVD is not elevated  respiratory: Decreased breath sounds at bases bilaterally with some crackles; no wheezing  CVS: S1-S2 heard, rate controlled currently Abdominal: Soft, nontender, slightly distended; no organomegaly, bowel sounds are heard Extremities: Trace lower extremity edema; no cyanosis  CNS: Awake and alert.  No focal neurologic deficit.  Moves extremities Lymph: No obvious lymphadenopathy Skin: No obvious ecchymosis/lesions  psych: Mostly flat affect.  Not agitated currently. musculoskeletal: No obvious joint swelling/deformity     Data Reviewed: I have personally reviewed following labs and imaging studies  CBC: Recent Labs  Lab 08/10/22 1539 08/10/22 1614 08/10/22 2338 08/11/22 0603 08/12/22 0011 08/13/22 0036  WBC 15.4*  --   --  14.0* 16.5* 14.0*  NEUTROABS 11.7*  --   --   --   --  10.9*  HGB 10.6* 9.9* 9.9* 9.7* 7.7* 6.7*  HCT 31.6* 29.0* 29.0* 29.0* 23.3* 19.6*  MCV 88.8  --   --  87.3 88.3 86.3  PLT 192  --   --  161 145* 127*  Basic Metabolic Panel: Recent Labs  Lab 08/10/22 1539 08/10/22 1614 08/10/22 2338 08/11/22 0323 08/12/22 0011 08/13/22 0036  NA 138 140 139 138 137 135  K 3.0* 3.6 4.4 4.5 4.4 4.2  CL 112* 107 105 109 107 105  CO2 19*  --   --  21* 21* 23  GLUCOSE 108* 119* 117* 129* 126* 122*  BUN 9 10 13 12 15 13   CREATININE 1.14 1.30* 1.20 1.38* 1.20 1.32*  CALCIUM 6.6*  --   --  8.1* 7.8*  7.6*  MG  --   --   --   --   --  1.8   GFR: Estimated Creatinine Clearance: 46.7 mL/min (A) (by C-G formula based on SCr of 1.32 mg/dL (H)). Liver Function Tests: Recent Labs  Lab 08/10/22 1539  AST 36  ALT 25  ALKPHOS 49  BILITOT 1.2  PROT 4.4*  ALBUMIN 2.7*   No results for input(s): "LIPASE", "AMYLASE" in the last 168 hours. No results for input(s): "AMMONIA" in the last 168 hours. Coagulation Profile: No results for input(s): "INR", "PROTIME" in the last 168 hours. Cardiac Enzymes: No results for input(s): "CKTOTAL", "CKMB", "CKMBINDEX", "TROPONINI" in the last 168 hours. BNP (last 3 results) No results for input(s): "PROBNP" in the last 8760 hours. HbA1C: No results for input(s): "HGBA1C" in the last 72 hours. CBG: No results for input(s): "GLUCAP" in the last 168 hours. Lipid Profile: No results for input(s): "CHOL", "HDL", "LDLCALC", "TRIG", "CHOLHDL", "LDLDIRECT" in the last 72 hours. Thyroid Function Tests: No results for input(s): "TSH", "T4TOTAL", "FREET4", "T3FREE", "THYROIDAB" in the last 72 hours. Anemia Panel: No results for input(s): "VITAMINB12", "FOLATE", "FERRITIN", "TIBC", "IRON", "RETICCTPCT" in the last 72 hours. Sepsis Labs: No results for input(s): "PROCALCITON", "LATICACIDVEN" in the last 168 hours.  Recent Results (from the past 240 hour(s))  Surgical pcr screen     Status: None   Collection Time: 08/10/22  5:20 PM   Specimen: Nasal Mucosa; Nasal Swab  Result Value Ref Range Status   MRSA, PCR NEGATIVE NEGATIVE Final   Staphylococcus aureus NEGATIVE NEGATIVE Final    Comment: (NOTE) The Xpert SA Assay (FDA approved for NASAL specimens in patients 39 years of age and older), is one component of a comprehensive surveillance program. It is not intended to diagnose infection nor to guide or monitor treatment. Performed at [facility], [address], [place], [place] [address]          Radiology Studies: DG FEMUR PORT, MIN 2  VIEWS RIGHT  Result Date: 08/12/2022 CLINICAL DATA:  Fracture EXAM: RIGHT FEMUR PORTABLE 2 VIEW COMPARISON:  Right femur x-ray 08/10/2022 FINDINGS: Comminuted proximal right femoral fracture involving the intratrochanteric region is again noted status post ORIF with hip screw and intramedullary nail. Alignment is near anatomic. There is no dislocation. There is lateral soft tissue swelling compatible with recent surgery. IMPRESSION: Comminuted proximal right femoral fracture status post ORIF. Alignment is near anatomic. Electronically Signed   By: Darliss Cheney M.D.   On: 08/12/2022 16:23   DG FEMUR, MIN 2 VIEWS RIGHT  Result Date: 08/12/2022 CLINICAL DATA:  Intramedullary nail intertrochanteric right femur. EXAM: RIGHT FEMUR 2 VIEWS COMPARISON:  Right femur radiographs 08/10/2022 FINDINGS: Images were performed intraoperatively without the presence of a radiologist. Redemonstration of displaced and comminuted proximal right femoral intertrochanteric and diaphyseal fracture. The patient is undergoing long cephalomedullary nail fixation. Improved fracture alignment. No hardware complication is seen. Total fluoroscopy images: 7 Total fluoroscopy time: 179  seconds Total dose: Radiation Exposure Index (as provided by the fluoroscopic device): 12.90 mGy air Kerma Please see intraoperative findings for further detail. IMPRESSION: Intraoperative fluoroscopic guidance for proximal right femoral fracture fixation. Electronically Signed   By: Neita Garnet M.D.   On: 08/12/2022 11:57   DG C-Arm 1-60 Min-No Report  Result Date: 08/12/2022 Fluoroscopy was utilized by the requesting physician.  No radiographic interpretation.   DG C-Arm 1-60 Min-No Report  Result Date: 08/12/2022 Fluoroscopy was utilized by the requesting physician.  No radiographic interpretation.        Scheduled Meds:  acetaminophen  650 mg Oral Q6H   aspirin  81 mg Oral Daily   atorvastatin  80 mg Oral Daily   docusate sodium  100 mg Oral  BID   ferrous sulfate  325 mg Oral TID PC   levothyroxine  50 mcg Oral q morning   pantoprazole  40 mg Oral Daily   Continuous Infusions:  0.9 % NaCl with KCl 20 mEq / L 100 mL/hr at 08/13/22 0732   methocarbamol (ROBAXIN) IV            Glade Lloyd, MD [facility] 08/13/2022, 8:25 AM

## 2022-08-13 NOTE — Progress Notes (Signed)
   ORTHOPAEDIC PROGRESS NOTE  s/p Procedure(s): INTRAMEDULLARY (IM) NAIL INTERTROCHANTERIC on 8/9 with Dr. Jena Gauss  SUBJECTIVE: Reports moderate pain about operative site.   No chest pain. No SOB. No nausea/vomiting. No other complaints.  OBJECTIVE: PE: General: sitting up in hospital bed, NAD RLE: dressing CDI, leg lengths equal, intact EHL/TA/GSC, endorses distal sensation, warm well perfused foot   Vitals:   08/13/22 0834 08/13/22 0943  BP: (!) 121/53 118/61  Pulse: 78   Resp: 16   Temp: 98.2 F (36.8 C)   SpO2: 99%      ASSESSMENT: Gregory Gibbs is a 77 y.o. male POD#1  PLAN: Weightbearing: WBAT RLE Insicional and dressing care: Reinforce dressings as needed Orthopedic device(s): None Showering: Hold for now VTE prophylaxis: Okay to restart Brilinta once Hgb stable. Received another until of pRBCs last night. Hgb 8.5 this AM.  Pain control: PRN pain medications, minimize narcotics as able Follow - up plan: 2 weeks in office with Dr. Jena Gauss Dispo: TBD. PT/OT evals today.  Contact information: After hours and holidays please check [email or web address] for group call information for [facility]  Alfonse Alpers, PA-C 08/13/2022

## 2022-08-13 NOTE — Evaluation (Signed)
Physical Therapy Evaluation Patient Details Name: Gregory Gibbs MRN: [id number] DOB: Aug 10, 1945 Today's Date: 08/13/2022  History of Present Illness  77 yo. Male presenting on 08/10/22 after a 10ft fall from his roof, brought to the ED and found to have R femur fx. He is now s/p IM nailing of right femur fracture on 08/11/22 and is WBAT post op.  Pt with significant PMH of inflammatory arthritis, hypothyroidism, ischemic cardiomyopathy, orthostatic hypotension, and unstable angina.  Clinical Impression  Pt is POD #1 and is moving min assist level from low recliner, taking a few steps with the RW to get back and forth from bed to chair.  He participated well in his HEP.  Family is interested in post acute therapy and have a preference for [facility] in [place].  PT to follow acutely for deficits listed below.       If plan is discharge home, recommend the following: A lot of help with walking and/or transfers;A lot of help with bathing/dressing/bathroom;Assistance with cooking/housework;Assist for transportation;Help with stairs or ramp for entrance   Can travel by private vehicle   Yes    Equipment Recommendations Rolling walker (2 wheels);Wheelchair (measurements PT);Wheelchair cushion (measurements PT);Hospital bed;BSC/3in1  Recommendations for Other Services       Functional Status Assessment Patient has had a recent decline in their functional status and demonstrates the ability to make significant improvements in function in a reasonable and predictable amount of time.     Precautions / Restrictions Precautions Precautions: Fall Restrictions Weight Bearing Restrictions: Yes RLE Weight Bearing: Weight bearing as tolerated      Mobility  Bed Mobility Overal bed mobility: Needs Assistance Bed Mobility: Sit to Supine       Sit to supine: Min assist   General bed mobility comments: Min assist mostly to help lift his right leg into bed, pt controlling his trunk and his L LE.     Transfers Overall transfer level: Needs assistance Equipment used: Rolling walker (2 wheels) Transfers: Sit to/from Stand, Bed to chair/wheelchair/BSC Sit to Stand: Min assist   Step pivot transfers: Min assist       General transfer comment: Min assist to support trunk when getting up from low chair.  Pt was able to take 4-5 pivotal steps with RW to the bed.  I anticipate he will be able to progress gait to the door or a short distance into the hallway tomorrow.    Ambulation/Gait                  Stairs            Wheelchair Mobility     Tilt Bed    Modified Rankin (Stroke Patients Only)       Balance Overall balance assessment: Needs assistance Sitting-balance support: Feet supported, No upper extremity supported, Bilateral upper extremity supported Sitting balance-Leahy Scale: Good Sitting balance - Comments: supervision EOB, but can remove hand support and maintain balance   Standing balance support: Bilateral upper extremity supported Standing balance-Leahy Scale: Poor Standing balance comment: needs support from RW mostly due to pain in R LE                             Pertinent Vitals/Pain Pain Assessment Pain Assessment: Faces Faces Pain Scale: Hurts whole lot Pain Location: R hip Pain Descriptors / Indicators: Aching, Discomfort, Grimacing, Guarding, Moaning Pain Intervention(s): Limited activity within patient's tolerance, Monitored during session, Repositioned, Premedicated before  session    Home Living Family/patient expects to be discharged to:: Private residence Living Arrangements: Spouse/significant other Available Help at Discharge: Family;Available 24 hours/day Type of Home: Mobile home Home Access: Stairs to enter   Entrance Stairs-Number of Steps: 2   Home Layout: One level Home Equipment: BSC/3in1;Rolling Walker (2 wheels);Wheelchair - manual      Prior Function Prior Level of Function : Independent/Modified  Independent;Driving             Mobility Comments: no AD ADLs Comments: ind     Extremity/Trunk Assessment   Upper Extremity Assessment Upper Extremity Assessment: Defer to OT evaluation    Lower Extremity Assessment Lower Extremity Assessment: RLE deficits/detail RLE Deficits / Details: right leg with normal post op pain and weakness, swelling in distal thigh.  pt at least 3/5 ankle DF/PF, 2+/5 knee extension, knee flexion, and 2-/5 hip.    Cervical / Trunk Assessment Cervical / Trunk Assessment: Normal  Communication   Communication Communication: No apparent difficulties  Cognition Arousal: Alert Behavior During Therapy: WFL for tasks assessed/performed Overall Cognitive Status: Within Functional Limits for tasks assessed                                          General Comments      Exercises Total Joint Exercises Ankle Circles/Pumps: AROM, Both, 20 reps Quad Sets: AROM, Right, 10 reps Heel Slides: AAROM, Right, 10 reps Hip ABduction/ADduction: AROM, Right, 10 reps   Assessment/Plan    PT Assessment Patient needs continued PT services  PT Problem List Decreased strength;Decreased range of motion;Decreased activity tolerance;Decreased balance;Decreased mobility;Decreased knowledge of use of DME;Pain       PT Treatment Interventions DME instruction;Gait training;Stair training;Functional mobility training;Therapeutic activities;Therapeutic exercise;Balance training;Patient/family education;Modalities    PT Goals (Current goals can be found in the Care Plan section)  Acute Rehab PT Goals Patient Stated Goal: to get back up on his feet and active again (maybe no more roofs). PT Goal Formulation: With patient Time For Goal Achievement: 08/27/22 Potential to Achieve Goals: Good    Frequency Min 1X/week     Co-evaluation               AM-PAC PT "6 Clicks" Mobility  Outcome Measure Help needed turning from your back to your side  while in a flat bed without using bedrails?: A Little Help needed moving from lying on your back to sitting on the side of a flat bed without using bedrails?: A Little Help needed moving to and from a bed to a chair (including a wheelchair)?: A Little Help needed standing up from a chair using your arms (e.g., wheelchair or bedside chair)?: A Little Help needed to walk in hospital room?: Total Help needed climbing 3-5 steps with a railing? : Total 6 Click Score: 14    End of Session Equipment Utilized During Treatment: Gait belt Activity Tolerance: Patient limited by pain Patient left: in bed;with call bell/phone within reach Nurse Communication: Mobility status PT Visit Diagnosis: Muscle weakness (generalized) (M62.81);Difficulty in walking, not elsewhere classified (R26.2);Pain Pain - Right/Left: Right Pain - part of body: Hip    Time: 1200-1249 PT Time Calculation (min) (ACUTE ONLY): 49 min   Charges:   PT Evaluation $PT Eval Moderate Complexity: 1 Mod PT Treatments $Gait Training: 8-22 mins $Therapeutic Exercise: 8-22 mins PT General Charges $$ ACUTE PT VISIT: 1 Visit  Corinna Capra, PT, DPT  Acute Rehabilitation Secure chat is best for contact #[phone] office

## 2022-08-13 NOTE — Progress Notes (Signed)
Rounding Note    Patient Name: Gregory Gibbs Date of Encounter: 08/13/2022  [facility] Cardiologist: Gypsy Balsam, MD   Subjective   Denies any chest pain or dyspnea  Inpatient Medications    Scheduled Meds:  acetaminophen  650 mg Oral Q6H   aspirin  81 mg Oral Daily   atorvastatin  80 mg Oral Daily   docusate sodium  100 mg Oral BID   ferrous sulfate  325 mg Oral TID PC   levothyroxine  50 mcg Oral q morning   pantoprazole  40 mg Oral Daily   Continuous Infusions:  0.9 % NaCl with KCl 20 mEq / L 100 mL/hr at 08/13/22 0732   methocarbamol (ROBAXIN) IV     PRN Meds: HYDROmorphone (DILAUDID) injection, methocarbamol **OR** methocarbamol (ROBAXIN) IV, metoCLOPramide **OR** metoCLOPramide (REGLAN) injection, morphine injection, ondansetron **OR** ondansetron (ZOFRAN) IV, oxyCODONE, polyethylene glycol   Vital Signs    Vitals:   08/13/22 0403 08/13/22 0455 08/13/22 0620 08/13/22 0834  BP: (!) 107/55  (!) 114/59 (!) 121/53  Pulse: 69  77 78  Resp: 16   16  Temp: 98 F (36.7 C)  98.8 F (37.1 C) 98.2 F (36.8 C)  TempSrc:   Oral Oral  SpO2: 98%  96% 99%  Weight:  68.3 kg    Height:        Intake/Output Summary (Last 24 hours) at 08/13/2022 0906 Last data filed at 08/13/2022 0732 Gross per 24 hour  Intake 2444.11 ml  Output 300 ml  Net 2144.11 ml      08/13/2022    4:55 AM 08/10/2022    3:53 PM 08/10/2022    3:44 PM  Last 3 Weights  Weight (lbs) 150 lb 9.2 oz 149 lb 14.6 oz 149 lb 14.6 oz  Weight (kg) 68.3 kg 68 kg 68 kg      Telemetry    NSR - Personally Reviewed  ECG    No new ECG - Personally Reviewed  Physical Exam   GEN: No acute distress.   Neck: No JVD Cardiac: RRR, no murmurs, rubs, or gallops.  Respiratory: Clear to auscultation bilaterally. GI: Soft, nontender, non-distended  MS: No edema Neuro:  Nonfocal  Psych: Normal affect   Labs    High Sensitivity Troponin:  No results for input(s): "TROPONINIHS" in the last  720 hours.   Chemistry Recent Labs  Lab 08/10/22 1539 08/10/22 1614 08/11/22 0323 08/12/22 0011 08/13/22 0036  NA 138   < > 138 137 135  K 3.0*   < > 4.5 4.4 4.2  CL 112*   < > 109 107 105  CO2 19*  --  21* 21* 23  GLUCOSE 108*   < > 129* 126* 122*  BUN 9   < > 12 15 13   CREATININE 1.14   < > 1.38* 1.20 1.32*  CALCIUM 6.6*  --  8.1* 7.8* 7.6*  MG  --   --   --   --  1.8  PROT 4.4*  --   --   --   --   ALBUMIN 2.7*  --   --   --   --   AST 36  --   --   --   --   ALT 25  --   --   --   --   ALKPHOS 49  --   --   --   --   BILITOT 1.2  --   --   --   --  GFRNONAA >60  --  53* >60 56*  ANIONGAP 7  --  8 9 7    < > = values in this interval not displayed.    Lipids No results for input(s): "CHOL", "TRIG", "HDL", "LABVLDL", "LDLCALC", "CHOLHDL" in the last 168 hours.  Hematology Recent Labs  Lab 08/11/22 0603 08/12/22 0011 08/13/22 0036 08/13/22 0806  WBC 14.0* 16.5* 14.0*  --   RBC 3.32* 2.64* 2.27*  --   HGB 9.7* 7.7* 6.7* 8.5*  HCT 29.0* 23.3* 19.6* 24.8*  MCV 87.3 88.3 86.3  --   MCH 29.2 29.2 29.5  --   MCHC 33.4 33.0 34.2  --   RDW 12.2 12.4 12.9  --   PLT 161 145* 127*  --    Thyroid No results for input(s): "TSH", "FREET4" in the last 168 hours.  BNPNo results for input(s): "BNP", "PROBNP" in the last 168 hours.  DDimer No results for input(s): "DDIMER" in the last 168 hours.   Radiology    DG FEMUR PORT, MIN 2 VIEWS RIGHT  Result Date: 08/12/2022 CLINICAL DATA:  Fracture EXAM: RIGHT FEMUR PORTABLE 2 VIEW COMPARISON:  Right femur x-ray 08/10/2022 FINDINGS: Comminuted proximal right femoral fracture involving the intratrochanteric region is again noted status post ORIF with hip screw and intramedullary nail. Alignment is near anatomic. There is no dislocation. There is lateral soft tissue swelling compatible with recent surgery. IMPRESSION: Comminuted proximal right femoral fracture status post ORIF. Alignment is near anatomic. Electronically Signed   By: Darliss Cheney M.D.   On: 08/12/2022 16:23   DG FEMUR, MIN 2 VIEWS RIGHT  Result Date: 08/12/2022 CLINICAL DATA:  Intramedullary nail intertrochanteric right femur. EXAM: RIGHT FEMUR 2 VIEWS COMPARISON:  Right femur radiographs 08/10/2022 FINDINGS: Images were performed intraoperatively without the presence of a radiologist. Redemonstration of displaced and comminuted proximal right femoral intertrochanteric and diaphyseal fracture. The patient is undergoing long cephalomedullary nail fixation. Improved fracture alignment. No hardware complication is seen. Total fluoroscopy images: 7 Total fluoroscopy time: 179 seconds Total dose: Radiation Exposure Index (as provided by the fluoroscopic device): 12.90 mGy air Kerma Please see intraoperative findings for further detail. IMPRESSION: Intraoperative fluoroscopic guidance for proximal right femoral fracture fixation. Electronically Signed   By: Neita Garnet M.D.   On: 08/12/2022 11:57   DG C-Arm 1-60 Min-No Report  Result Date: 08/12/2022 Fluoroscopy was utilized by the requesting physician.  No radiographic interpretation.   DG C-Arm 1-60 Min-No Report  Result Date: 08/12/2022 Fluoroscopy was utilized by the requesting physician.  No radiographic interpretation.    Cardiac Studies     Patient Profile     77 y.o. male with a history of CAD, chronic systolic heart failure, hyperlipidemia, orthostatic hypotension, CKD stage III, hypothyroidism who are consulted for evaluation of CAD/preop evaluation   Assessment & Plan    R hip fracture: occurred after falling off room while attempting to clean gutter.  Underwent surgery yesterday morning. -Continue ASA 81 mg daily -Restart brilinta when safe per ortho -- pt received another unit of pRBCs last night with 2 point increase in Hb -- suspect the 6.7 value was artificially low   CAD s/p recent DES to prox LAD 06/03/22.  Will need to minimize time off DAPT given recent coronary stent.  Continuing aspirin 80  mg.  Restart brilinta as soon as reasonably safe.  H/o syncope: patient had bradycardia down to 30s and positive orthostatic hypotension that lead to syncope in May, beta blocker removed at the time.  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 Euvolemic on exam   Hyperlipidemia: continue lipitor 80mg  daily   CKD stage III: Cr at baseline  Anemia: Hgb 8.5 this morning; received 1 units PRBCs   We will sign off For questions or updates, please contact [facility] Please consult [email or web address] for contact info under      Signed, Maurice Small, MD  08/13/2022, 9:06 AM

## 2022-08-13 NOTE — Progress Notes (Signed)
    Patient Name: Gregory Gibbs           DOB: 1945/02/27  MRN: [id number]      Admission Date: 08/10/2022  Attending Provider: Glade Lloyd, MD  Primary Diagnosis: Closed fracture of right femur [facility])   Level of care: Med-Surg    CROSS COVER NOTE   Date of Service   08/13/2022   Gregory Gibbs, 77 y.o. male, was admitted on 08/10/2022 for Closed fracture of right femur (HCC).    HPI/Events of Note   Acute Blood Loss Anemia Normocytic anemia Hemoglobin 7.7 -->  6.7.  No acute changes reported.  Hemodynamically stable. No melena, hematochezia, or other bleeding reported tonight.  Underwent cephalomedullary nailing of right intertrochanteric/subtrochanteric femur fracture on 8/9 with EBL of 250 cc.     Interventions/ Plan   Blood transfusion, 1 unit PRBC        Anthoney Harada, DNP, [employer] [facility]

## 2022-08-13 NOTE — Evaluation (Signed)
Occupational Therapy Evaluation Patient Details Name: Gregory Gibbs DOBROWOLSKI MRN: [id number] DOB: 12-20-1945 Today's Date: 08/13/2022   History of Present Illness 77 yo. Male presenting after a 53ft fall from his roof, brought to the ED and found to have R femur fx. He is now s/p Cephalomedullary nailing of right intertrochanteric/subtrochanteric femur fracture on 08/11/22.   Clinical Impression   Pt admitted for above, PTA pt was independent in mobility and ADLs and very active. Pt currently very limited by pain in R hip with drops in BP following mobility. He needed assist with RLE management during bed mobility, standing deferred due to pain and low BP EOB. Mod A for stand pivot. Pt would benefit from continued acute skilled OT services to address deficits and help transition to next level of care. At this time OT recommending SNF based on functional performance during eval but anticipate pt to progress to [facility] level with more acute therapy.        If plan is discharge home, recommend the following: A little help with bathing/dressing/bathroom;Assistance with cooking/housework;Assist for transportation    Functional Status Assessment  Patient has had a recent decline in their functional status and demonstrates the ability to make significant improvements in function in a reasonable and predictable amount of time.  Equipment Recommendations  Tub/shower bench    Recommendations for Other Services       Precautions / Restrictions Precautions Precautions: None Restrictions Weight Bearing Restrictions: No RLE Weight Bearing: Weight bearing as tolerated      Mobility Bed Mobility Overal bed mobility: Needs Assistance Bed Mobility: Supine to Sit     Supine to sit: Mod assist     General bed mobility comments: Mod A for RLE management + slow movement to get to EOB.    Transfers Overall transfer level: Needs assistance   Transfers: Bed to chair/wheelchair/BSC   Stand pivot transfers:  Mod assist                Balance Overall balance assessment: Needs assistance Sitting-balance support: Feet unsupported, No upper extremity supported Sitting balance-Leahy Scale: Fair                                     ADL either performed or assessed with clinical judgement   ADL Overall ADL's : Needs assistance/impaired Eating/Feeding: Independent;Bed level   Grooming: Contact guard assist;Sitting   Upper Body Bathing: Modified independent;Bed level   Lower Body Bathing: Maximal assistance;Sitting/lateral leans   Upper Body Dressing : Modified independent;Bed level   Lower Body Dressing: Maximal assistance;Sitting/lateral leans   Toilet Transfer: Maximal assistance;Stand-pivot   Toileting- Clothing Manipulation and Hygiene: Maximal assistance;+2 for physical assistance;+2 for safety/equipment;Sit to/from stand   Tub/ Engineer, structural: Maximal assistance     General ADL Comments: Pt very limited by pain and low BP, OOB mobility limited     Vision         Perception         Praxis         Pertinent Vitals/Pain Pain Assessment Pain Assessment: Faces Faces Pain Scale: Hurts whole lot Pain Location: R hip Pain Descriptors / Indicators: Aching, Discomfort, Grimacing, Guarding, Moaning Pain Intervention(s): Limited activity within patient's tolerance, Monitored during session, Repositioned, RN gave pain meds during session     Extremity/Trunk Assessment Upper Extremity Assessment Upper Extremity Assessment: Overall WFL for tasks assessed   Lower Extremity Assessment Lower Extremity Assessment: Defer  to PT evaluation       Communication Communication Communication: No apparent difficulties   Cognition Arousal: Alert Behavior During Therapy: WFL for tasks assessed/performed Overall Cognitive Status: Within Functional Limits for tasks assessed                                       General Comments  BP 118/61 supine,  119/45 EOB, 125/49 in Recliner.    Exercises     Shoulder Instructions      Home Living                                          Prior Functioning/Environment                          OT Problem List: Decreased activity tolerance;Impaired balance (sitting and/or standing);Pain      OT Treatment/Interventions: Self-care/ADL training;Balance training;Therapeutic exercise;Therapeutic activities;Patient/family education    OT Goals(Current goals can be found in the care plan section) Acute Rehab OT Goals Patient Stated Goal: To be able to go home OT Goal Formulation: With patient Time For Goal Achievement: 08/27/22 Potential to Achieve Goals: Good ADL Goals Pt Will Perform Grooming: with supervision;with set-up;sitting Pt Will Perform Lower Body Bathing: with supervision;with adaptive equipment Pt Will Perform Lower Body Dressing: with supervision;sitting/lateral leans;with adaptive equipment Pt Will Transfer to Toilet: with contact guard assist;stand pivot transfer  OT Frequency: Min 1X/week    Co-evaluation              AM-PAC OT "6 Clicks" Daily Activity     Outcome Measure Help from another person eating meals?: None Help from another person taking care of personal grooming?: A Little Help from another person toileting, which includes using toliet, bedpan, or urinal?: A Lot Help from another person bathing (including washing, rinsing, drying)?: A Lot Help from another person to put on and taking off regular upper body clothing?: None Help from another person to put on and taking off regular lower body clothing?: A Lot 6 Click Score: 17   End of Session Equipment Utilized During Treatment: Gait belt Nurse Communication: Mobility status  Activity Tolerance: Patient limited by pain Patient left: with call bell/phone within reach;with chair alarm set;in chair  OT Visit Diagnosis: Unsteadiness on feet (R26.81);Other abnormalities of gait  and mobility (R26.89);Pain Pain - Right/Left: Right Pain - part of body: Hip                Time: 0915-1007 OT Time Calculation (min): 52 min Charges:  OT General Charges $OT Visit: 1 Visit OT Evaluation $OT Eval Moderate Complexity: 1 Mod OT Treatments $Therapeutic Activity: 23-37 mins  08/13/2022  AB, OTR/L  Acute Rehabilitation Services  Office: [phone]   Tristan Schroeder 08/13/2022, 4:19 PM

## 2022-08-14 DIAGNOSIS — S7291XA Unspecified fracture of right femur, initial encounter for closed fracture: Secondary | ICD-10-CM | POA: Diagnosis not present

## 2022-08-14 DIAGNOSIS — I2510 Atherosclerotic heart disease of native coronary artery without angina pectoris: Secondary | ICD-10-CM | POA: Diagnosis not present

## 2022-08-14 LAB — MAGNESIUM: Magnesium: 1.8 mg/dL (ref 1.7–2.4)

## 2022-08-14 LAB — CBC WITH DIFFERENTIAL/PLATELET
Abs Immature Granulocytes: 0.05 10*3/uL (ref 0.00–0.07)
Basophils Absolute: 0 10*3/uL (ref 0.0–0.1)
Basophils Relative: 0 %
Eosinophils Absolute: 0.4 10*3/uL (ref 0.0–0.5)
Eosinophils Relative: 3 %
HCT: 23.2 % — ABNORMAL LOW (ref 39.0–52.0)
Hemoglobin: 7.7 g/dL — ABNORMAL LOW (ref 13.0–17.0)
Immature Granulocytes: 0 %
Lymphocytes Relative: 24 %
Lymphs Abs: 2.9 10*3/uL (ref 0.7–4.0)
MCH: 30.4 pg (ref 26.0–34.0)
MCHC: 33.2 g/dL (ref 30.0–36.0)
MCV: 91.7 fL (ref 80.0–100.0)
Monocytes Absolute: 1.4 10*3/uL — ABNORMAL HIGH (ref 0.1–1.0)
Monocytes Relative: 11 %
Neutro Abs: 7.3 10*3/uL (ref 1.7–7.7)
Neutrophils Relative %: 62 %
Platelets: 127 10*3/uL — ABNORMAL LOW (ref 150–400)
RBC: 2.53 MIL/uL — ABNORMAL LOW (ref 4.22–5.81)
RDW: 13.9 % (ref 11.5–15.5)
WBC: 12 10*3/uL — ABNORMAL HIGH (ref 4.0–10.5)
nRBC: 0 % (ref 0.0–0.2)

## 2022-08-14 LAB — BASIC METABOLIC PANEL
Anion gap: 9 (ref 5–15)
BUN: 13 mg/dL (ref 8–23)
CO2: 25 mmol/L (ref 22–32)
Calcium: 7.8 mg/dL — ABNORMAL LOW (ref 8.9–10.3)
Chloride: 104 mmol/L (ref 98–111)
Creatinine, Ser: 1.17 mg/dL (ref 0.61–1.24)
GFR, Estimated: >60 mL/min (ref >60)
Glucose, Bld: 92 mg/dL (ref 70–99)
Potassium: 3.9 mmol/L (ref 3.5–5.1)
Sodium: 138 mmol/L (ref 135–145)

## 2022-08-14 MED ORDER — TICAGRELOR 90 MG PO TABS
90.0000 mg | ORAL_TABLET | Freq: Two times a day (BID) | ORAL | Status: DC
  Administered 2022-08-14 – 2022-08-18 (×8): 90 mg via ORAL
  Filled 2022-08-14 (×11): qty 1

## 2022-08-14 MED ORDER — METHOCARBAMOL 1000 MG/10ML IJ SOLN
1000.0000 mg | Freq: Four times a day (QID) | INTRAVENOUS | Status: DC | PRN
  Filled 2022-08-14: qty 10

## 2022-08-14 MED ORDER — TICAGRELOR 90 MG PO TABS: 90.0000 mg | ORAL_TABLET | Freq: Two times a day (BID) | ORAL | Status: DC

## 2022-08-14 MED ORDER — METHOCARBAMOL 750 MG PO TABS
750.0000 mg | ORAL_TABLET | Freq: Four times a day (QID) | ORAL | Status: DC | PRN
  Administered 2022-08-15 – 2022-08-17 (×7): 750 mg via ORAL
  Filled 2022-08-14 (×7): qty 1

## 2022-08-14 MED ORDER — CELECOXIB 200 MG PO CAPS
200.0000 mg | ORAL_CAPSULE | Freq: Two times a day (BID) | ORAL | Status: DC
  Administered 2022-08-14 – 2022-08-18 (×9): 200 mg via ORAL
  Filled 2022-08-14 (×11): qty 1

## 2022-08-14 NOTE — Progress Notes (Signed)
Physical Therapy Treatment Patient Details Name: Gregory Gibbs MRN: [id number] DOB: [birth year] Today's Date: 08/14/2022   History of Present Illness 77 yo. Male presenting on 08/10/22 after a 71ft fall from his roof, brought to the ED and found to have R femur fx. He is now s/p IM nailing of right femur fracture on 08/11/22 and is WBAT post op.  Pt with significant PMH of inflammatory arthritis, hypothyroidism, ischemic cardiomyopathy, orthostatic hypotension, and unstable angina.    PT Comments  Pt reports increased pain and swelling overnight. Premedicated with PO and IV pain medication prior to session. Performed warm up AAROM/AROM exercises for RLE. Pt requiring min assist overall for functional mobility. Ambulating ~3 ft with a walker from bed to chair with antalgic gait pattern. Ice packs and elevation provided post session. Patient will benefit from continued inpatient follow up therapy, <3 hours/day.    If plan is discharge home, recommend the following: A lot of help with walking and/or transfers;A lot of help with bathing/dressing/bathroom;Assistance with cooking/housework;Assist for transportation;Help with stairs or ramp for entrance   Can travel by private vehicle     Yes  Equipment Recommendations  Rolling walker (2 wheels);Wheelchair (measurements PT);Wheelchair cushion (measurements PT);Hospital bed;BSC/3in1    Recommendations for Other Services       Precautions / Restrictions Precautions Precautions: Fall Restrictions Weight Bearing Restrictions: Yes RLE Weight Bearing: Weight bearing as tolerated     Mobility  Bed Mobility Overal bed mobility: Needs Assistance Bed Mobility: Supine to Sit       Sit to supine: Min assist   General bed mobility comments: Min assist mostly to help lift his right leg off of bed    Transfers Overall transfer level: Needs assistance Equipment used: Rolling walker (2 wheels) Transfers: Sit to/from Stand Sit to Stand: Min assist            General transfer comment: MinA from elevated bed height, cues for hand placement and sliding R foot out prior to sitting for pain control    Ambulation/Gait Ambulation/Gait assistance: Min assist Gait Distance (Feet): 3 Feet Assistive device: Rolling walker (2 wheels) Gait Pattern/deviations: Step-to pattern, Decreased stance time - right, Decreased dorsiflexion - right, Decreased weight shift to right, Antalgic Gait velocity: decreased Gait velocity interpretation: <1.31 ft/sec, indicative of household ambulator   General Gait Details: Verbal cues for walker use, sequencing/technique. Heavy reliance through arms on walker. Difficulty clearing R foot   Stairs             Wheelchair Mobility     Tilt Bed    Modified Rankin (Stroke Patients Only)       Balance Overall balance assessment: Needs assistance Sitting-balance support: Feet supported, No upper extremity supported, Bilateral upper extremity supported Sitting balance-Leahy Scale: Good     Standing balance support: Bilateral upper extremity supported Standing balance-Leahy Scale: Poor Standing balance comment: needs support from RW mostly due to pain in R LE                            Cognition Arousal: Alert Behavior During Therapy: WFL for tasks assessed/performed Overall Cognitive Status: Within Functional Limits for tasks assessed                                          Exercises General Exercises - Lower Extremity Ankle Circles/Pumps: AROM,  Right, 20 reps, Supine Quad Sets: AROM, Right, 10 reps, Supine Gluteal Sets: Both, 10 reps, Supine Short Arc Quad: AAROM, Right, 10 reps, Supine Heel Slides: AAROM, Right, 5 reps, Supine Hip ABduction/ADduction: AAROM, Right, 5 reps, Supine    General Comments        Pertinent Vitals/Pain Pain Assessment Pain Assessment: Faces Faces Pain Scale: Hurts whole lot Pain Location: R hip/thigh Pain Descriptors /  Indicators: Aching, Discomfort, Grimacing, Guarding, Moaning, Tightness Pain Intervention(s): Limited activity within patient's tolerance, Monitored during session, Premedicated before session    Home Living                          Prior Function            PT Goals (current goals can now be found in the care plan section) Acute Rehab PT Goals Patient Stated Goal: to get back up on his feet and active again (maybe no more roofs). PT Goal Formulation: With patient Time For Goal Achievement: 08/27/22 Potential to Achieve Goals: Good Progress towards PT goals: Progressing toward goals    Frequency    Min 1X/week      PT Plan      Co-evaluation              AM-PAC PT "6 Clicks" Mobility   Outcome Measure  Help needed turning from your back to your side while in a flat bed without using bedrails?: A Little Help needed moving from lying on your back to sitting on the side of a flat bed without using bedrails?: A Little Help needed moving to and from a bed to a chair (including a wheelchair)?: A Little Help needed standing up from a chair using your arms (e.g., wheelchair or bedside chair)?: A Little Help needed to walk in hospital room?: Total Help needed climbing 3-5 steps with a railing? : Total 6 Click Score: 14    End of Session Equipment Utilized During Treatment: Gait belt Activity Tolerance: Patient limited by pain Patient left: with call bell/phone within reach;in chair;with chair alarm set Nurse Communication: Mobility status PT Visit Diagnosis: Muscle weakness (generalized) (M62.81);Difficulty in walking, not elsewhere classified (R26.2);Pain Pain - Right/Left: Right Pain - part of body: Hip     Time: 1191-4782 PT Time Calculation (min) (ACUTE ONLY): 31 min  Charges:    $Therapeutic Exercise: 8-22 mins $Therapeutic Activity: 8-22 mins PT General Charges $$ ACUTE PT VISIT: 1 Visit                     Lillia Pauls, PT, DPT Acute  Rehabilitation Services Office [phone]    Norval Morton 08/14/2022, 10:00 AM

## 2022-08-14 NOTE — Progress Notes (Signed)
   ORTHOPAEDIC PROGRESS NOTE  s/p Procedure(s): INTRAMEDULLARY (IM) NAIL INTERTROCHANTERIC on 8/9 with Dr. Jena Gauss  SUBJECTIVE: Reports moderate pain about operative site. He had more pain overnight and swelling. He was working with PT this morning.   No chest pain. No SOB. No nausea/vomiting. No other complaints.  OBJECTIVE: PE: General: sitting up in recliner, NAD RLE: dressing CDI, swelling of right thigh as to be expected, leg lengths equal, intact EHL/TA/GSC, endorses distal sensation, warm well perfused foot   Vitals:   08/14/22 0415 08/14/22 0741  BP: (!) 112/58 111/62  Pulse: 85 84  Resp: 15 18  Temp: 99 F (37.2 C) 98.6 F (37 C)  SpO2: 98% 96%     ASSESSMENT: Gregory Gibbs is a 77 y.o. male POD#2  PLAN: Weightbearing: WBAT RLE Insicional and dressing care: Reinforce dressings as needed Orthopedic device(s): None Showering: Hold for now VTE prophylaxis: Okay to restart Brilinta once Hgb stable. Received pRBC yesterday.  Pain control: PRN pain medications, minimize narcotics as able Follow - up plan: 2 weeks in office with Dr. Jena Gauss Dispo: TBD. PT/OT recommending SNF at this time. TOC following.  Contact information: After hours and holidays please check [email or web address] for group call information for [facility]  Alfonse Alpers, PA-C 08/14/2022

## 2022-08-14 NOTE — Progress Notes (Signed)
PROGRESS NOTE    Gregory Gibbs  WGN:[id number] DOB: 13-Jun-1945 DOA: 08/10/2022 PCP: Hadley Pen, MD   Brief Narrative:  77 y.o. male with past medical history of CAD with recent stenting few months ago, hypothyroidism, and GERD presented with a mechanical fall and was found to have comminuted fracture of the proximal right femur and was admitted under orthopedic service.  He underwent closed reduction of femur fracture with placement of lower extremity traction pin on 08/10/2022.  TR[facility] was consulted on 08/11/2022 for medical management.  Subsequently, care was transferred to [facility] service.  Assessment & Plan:   Closed comminuted fracture of the proximal right femur after a mechanical fall -Management as per orthopedics team. -underwent closed reduction of femur fracture with placement of lower extremity traction pin on 08/10/2022.  -Underwent cephalomedullary nailing of right intertrochanteric/subtrochanteric femur fracture today by orthopedics. -Wound care/pain management as per orthopedics. -PT recommending SNF placement.  Consult TOC.  Leukocytosis -Possibly reactive.  Improving.  Monitor.  No signs of infection at this time.  Labs pending for today.  Thrombocytopenia -Questionable cause.  Monitor intermittently.  Normocytic anemia/possible acute blood loss anemia -Possibly from femur fracture.  Status post 2 units packed red cells transfusion during this hospitalization, had 1 unit transfused yesterday for hemoglobin of 6.7.  Subsequent hemoglobin was 8.5.  Hemoglobin pending today.  Monitor.  CAD status post recent LAD stenting Chronic systolic heart failure Hypertension Hyperlipidemia -Cardiology following.  Currently on aspirin and statin.  Brilinta to be resumed today if hemoglobin remains stable today. Discussed with Dr. Mealor/Cardiology  CKD stage IIIa -Creatinine currently stable.  Monitor intermittently.  Labs pending today.  Hypothyroidism -Continue  levothyroxine  Subjective: Patient seen and examined at bedside.  No fever, chest pain, shortness of breath or vomiting reported.  Complains of intermittent right hip pain.   Objective: Vitals:   08/13/22 1939 08/14/22 0415 08/14/22 0500 08/14/22 0741  BP: (!) 104/53 (!) 112/58  111/62  Pulse: 76 85  84  Resp: 16 15  18   Temp: 98.4 F (36.9 C) 99 F (37.2 C)  98.6 F (37 C)  TempSrc: Oral Oral  Oral  SpO2: 99% 98%  96%  Weight:   68.6 kg   Height:        Intake/Output Summary (Last 24 hours) at 08/14/2022 0823 Last data filed at 08/13/2022 2203 Gross per 24 hour  Intake 0 ml  Output 300 ml  Net -300 ml   Filed Weights   08/10/22 1553 08/13/22 0455 08/14/22 0500  Weight: 68 kg 68.3 kg 68.6 kg    Examination:  General: No acute distress.  Currently on room air.  Chronically ill and deconditioned. ENT/neck: No obvious neck masses or JVD elevation noted  respiratory: Bilateral decreased breath sounds at bases with scattered crackles  CVS: Rate mostly controlled; S1 and S2 are heard Abdominal: Soft, nontender, distended mildly; no organomegaly, normal bowel sounds are heard extremities: No clubbing; mild lower extremity edema present CNS: Alert and oriented.  No obvious focal neurological deficits Lymph: No palpable lymphadenopathy Skin: No obvious petechiae/rashes psych: Affect is flat.  Currently not agitated.    Data Reviewed: I have personally reviewed following labs and imaging studies  CBC: Recent Labs  Lab 08/10/22 1539 08/10/22 1614 08/10/22 2338 08/11/22 0603 08/12/22 0011 08/13/22 0036 08/13/22 0806  WBC 15.4*  --   --  14.0* 16.5* 14.0*  --   NEUTROABS 11.7*  --   --   --   --  10.9*  --   HGB 10.6*   < > 9.9* 9.7* 7.7* 6.7* 8.5*  HCT 31.6*   < > 29.0* 29.0* 23.3* 19.6* 24.8*  MCV 88.8  --   --  87.3 88.3 86.3  --   PLT 192  --   --  161 145* 127*  --    < > = values in this interval not displayed.   Basic Metabolic Panel: Recent Labs  Lab  08/10/22 1539 08/10/22 1614 08/10/22 2338 08/11/22 0323 08/12/22 0011 08/13/22 0036  NA 138 140 139 138 137 135  K 3.0* 3.6 4.4 4.5 4.4 4.2  CL 112* 107 105 109 107 105  CO2 19*  --   --  21* 21* 23  GLUCOSE 108* 119* 117* 129* 126* 122*  BUN 9 10 13 12 15 13   CREATININE 1.14 1.30* 1.20 1.38* 1.20 1.32*  CALCIUM 6.6*  --   --  8.1* 7.8* 7.6*  MG  --   --   --   --   --  1.8   GFR: Estimated Creatinine Clearance: 46.9 mL/min (A) (by C-G formula based on SCr of 1.32 mg/dL (H)). Liver Function Tests: Recent Labs  Lab 08/10/22 1539  AST 36  ALT 25  ALKPHOS 49  BILITOT 1.2  PROT 4.4*  ALBUMIN 2.7*   No results for input(s): "LIPASE", "AMYLASE" in the last 168 hours. No results for input(s): "AMMONIA" in the last 168 hours. Coagulation Profile: No results for input(s): "INR", "PROTIME" in the last 168 hours. Cardiac Enzymes: No results for input(s): "CKTOTAL", "CKMB", "CKMBINDEX", "TROPONINI" in the last 168 hours. BNP (last 3 results) No results for input(s): "PROBNP" in the last 8760 hours. HbA1C: No results for input(s): "HGBA1C" in the last 72 hours. CBG: No results for input(s): "GLUCAP" in the last 168 hours. Lipid Profile: No results for input(s): "CHOL", "HDL", "LDLCALC", "TRIG", "CHOLHDL", "LDLDIRECT" in the last 72 hours. Thyroid Function Tests: No results for input(s): "TSH", "T4TOTAL", "FREET4", "T3FREE", "THYROIDAB" in the last 72 hours. Anemia Panel: No results for input(s): "VITAMINB12", "FOLATE", "FERRITIN", "TIBC", "IRON", "RETICCTPCT" in the last 72 hours. Sepsis Labs: No results for input(s): "PROCALCITON", "LATICACIDVEN" in the last 168 hours.  Recent Results (from the past 240 hour(s))  Surgical pcr screen     Status: None   Collection Time: 08/10/22  5:20 PM   Specimen: Nasal Mucosa; Nasal Swab  Result Value Ref Range Status   MRSA, PCR NEGATIVE NEGATIVE Final   Staphylococcus aureus NEGATIVE NEGATIVE Final    Comment: (NOTE) The Xpert SA  Assay (FDA approved for NASAL specimens in patients 44 years of age and older), is one component of a comprehensive surveillance program. It is not intended to diagnose infection nor to guide or monitor treatment. Performed at [facility], [address] [place], [place] [address]          Radiology Studies: DG FEMUR PORT, MIN 2 VIEWS RIGHT  Result Date: 08/12/2022 CLINICAL DATA:  Fracture EXAM: RIGHT FEMUR PORTABLE 2 VIEW COMPARISON:  Right femur x-ray 08/10/2022 FINDINGS: Comminuted proximal right femoral fracture involving the intratrochanteric region is again noted status post ORIF with hip screw and intramedullary nail. Alignment is near anatomic. There is no dislocation. There is lateral soft tissue swelling compatible with recent surgery. IMPRESSION: Comminuted proximal right femoral fracture status post ORIF. Alignment is near anatomic. Electronically Signed   By: Darliss Cheney M.D.   On: 08/12/2022 16:23   DG FEMUR, MIN 2 VIEWS  RIGHT  Result Date: 08/12/2022 CLINICAL DATA:  Intramedullary nail intertrochanteric right femur. EXAM: RIGHT FEMUR 2 VIEWS COMPARISON:  Right femur radiographs 08/10/2022 FINDINGS: Images were performed intraoperatively without the presence of a radiologist. Redemonstration of displaced and comminuted proximal right femoral intertrochanteric and diaphyseal fracture. The patient is undergoing long cephalomedullary nail fixation. Improved fracture alignment. No hardware complication is seen. Total fluoroscopy images: 7 Total fluoroscopy time: 179 seconds Total dose: Radiation Exposure Index (as provided by the fluoroscopic device): 12.90 mGy air Kerma Please see intraoperative findings for further detail. IMPRESSION: Intraoperative fluoroscopic guidance for proximal right femoral fracture fixation. Electronically Signed   By: Neita Garnet M.D.   On: 08/12/2022 11:57   DG C-Arm 1-60 Min-No Report  Result Date: 08/12/2022 Fluoroscopy was utilized by the  requesting physician.  No radiographic interpretation.   DG C-Arm 1-60 Min-No Report  Result Date: 08/12/2022 Fluoroscopy was utilized by the requesting physician.  No radiographic interpretation.        Scheduled Meds:  acetaminophen  650 mg Oral Q6H   aspirin  81 mg Oral Daily   atorvastatin  80 mg Oral Daily   docusate sodium  100 mg Oral BID   ferrous sulfate  325 mg Oral TID PC   levothyroxine  50 mcg Oral q morning   pantoprazole  40 mg Oral Daily   Continuous Infusions:  methocarbamol (ROBAXIN) IV            Glade Lloyd, MD [facility] 08/14/2022, 8:23 AM

## 2022-08-14 NOTE — Progress Notes (Signed)
Rounding Note    Patient Name: Gregory Gibbs Date of Encounter: 08/14/2022  Gregory Gibbs Cardiologist: Gregory Balsam, MD   Subjective   Denies any chest pain or dyspnea  Inpatient Medications    Scheduled Meds:  acetaminophen  650 mg Oral Q6H   aspirin  81 mg Oral Daily   atorvastatin  80 mg Oral Daily   docusate sodium  100 mg Oral BID   ferrous sulfate  325 mg Oral TID PC   levothyroxine  50 mcg Oral q morning   pantoprazole  40 mg Oral Daily   Continuous Infusions:  methocarbamol (ROBAXIN) IV     PRN Meds: HYDROmorphone (DILAUDID) injection, methocarbamol **OR** methocarbamol (ROBAXIN) IV, metoCLOPramide **OR** metoCLOPramide (REGLAN) injection, morphine injection, ondansetron **OR** ondansetron (ZOFRAN) IV, oxyCODONE, polyethylene glycol   Vital Signs    Vitals:   08/13/22 1939 08/14/22 0415 08/14/22 0500 08/14/22 0741  BP: (!) 104/53 (!) 112/58  111/62  Pulse: 76 85  84  Resp: 16 15  18   Temp: 98.4 F (36.9 C) 99 F (37.2 C)  98.6 F (37 C)  TempSrc: Oral Oral  Oral  SpO2: 99% 98%  96%  Weight:   68.6 kg   Height:        Intake/Output Summary (Last 24 hours) at 08/14/2022 0906 Last data filed at 08/13/2022 2203 Gross per 24 hour  Intake 0 ml  Output 300 ml  Net -300 ml      08/14/2022    5:00 AM 08/13/2022    4:55 AM 08/10/2022    3:53 PM  Last 3 Weights  Weight (lbs) 151 lb 3.8 oz 150 lb 9.2 oz 149 lb 14.6 oz  Weight (kg) 68.6 kg 68.3 kg 68 kg      Telemetry    NSR - Personally Reviewed  ECG    No new ECG - Personally Reviewed  Physical Exam   GEN: No acute distress.   Neck: No JVD Cardiac: RRR, no murmurs, rubs, or gallops.  Respiratory: Clear to auscultation bilaterally. GI: Soft, nontender, non-distended  MS: No edema Neuro:  Nonfocal  Psych: Normal affect   Labs    High Sensitivity Troponin:  No results for input(s): "TROPONINIHS" in the last 720 hours.   Chemistry Recent Labs  Lab 08/10/22 1539  08/10/22 1614 08/11/22 0323 08/12/22 0011 08/13/22 0036  NA 138   < > 138 137 135  K 3.0*   < > 4.5 4.4 4.2  CL 112*   < > 109 107 105  CO2 19*  --  21* 21* 23  GLUCOSE 108*   < > 129* 126* 122*  BUN 9   < > 12 15 13   CREATININE 1.14   < > 1.38* 1.20 1.32*  CALCIUM 6.6*  --  8.1* 7.8* 7.6*  MG  --   --   --   --  1.8  PROT 4.4*  --   --   --   --   ALBUMIN 2.7*  --   --   --   --   AST 36  --   --   --   --   ALT 25  --   --   --   --   ALKPHOS 49  --   --   --   --   BILITOT 1.2  --   --   --   --   GFRNONAA >60  --  53* >60 56*  ANIONGAP 7  --  8 9 7    < > = values in this interval not displayed.    Lipids No results for input(s): "CHOL", "TRIG", "HDL", "LABVLDL", "LDLCALC", "CHOLHDL" in the last 168 hours.  Hematology Recent Labs  Lab 08/11/22 0603 08/12/22 0011 08/13/22 0036 08/13/22 0806  WBC 14.0* 16.5* 14.0*  --   RBC 3.32* 2.64* 2.27*  --   HGB 9.7* 7.7* 6.7* 8.5*  HCT 29.0* 23.3* 19.6* 24.8*  MCV 87.3 88.3 86.3  --   MCH 29.2 29.2 29.5  --   MCHC 33.4 33.0 34.2  --   RDW 12.2 12.4 12.9  --   PLT 161 145* 127*  --    Thyroid No results for input(s): "TSH", "FREET4" in the last 168 hours.  BNPNo results for input(s): "BNP", "PROBNP" in the last 168 hours.  DDimer No results for input(s): "DDIMER" in the last 168 hours.   Radiology    DG FEMUR PORT, MIN 2 VIEWS RIGHT  Result Date: 08/12/2022 CLINICAL DATA:  Fracture EXAM: RIGHT FEMUR PORTABLE 2 VIEW COMPARISON:  Right femur x-ray 08/10/2022 FINDINGS: Comminuted proximal right femoral fracture involving the intratrochanteric region is again noted status post ORIF with hip screw and intramedullary nail. Alignment is near anatomic. There is no dislocation. There is lateral soft tissue swelling compatible with recent surgery. IMPRESSION: Comminuted proximal right femoral fracture status post ORIF. Alignment is near anatomic. Electronically Signed   By: Gregory Gibbs M.D.   On: 08/12/2022 16:23   DG FEMUR, MIN 2  VIEWS RIGHT  Result Date: 08/12/2022 CLINICAL DATA:  Intramedullary nail intertrochanteric right femur. EXAM: RIGHT FEMUR 2 VIEWS COMPARISON:  Right femur radiographs 08/10/2022 FINDINGS: Images were performed intraoperatively without the presence of a radiologist. Redemonstration of displaced and comminuted proximal right femoral intertrochanteric and diaphyseal fracture. The patient is undergoing long cephalomedullary nail fixation. Improved fracture alignment. No hardware complication is seen. Total fluoroscopy images: 7 Total fluoroscopy time: 179 seconds Total dose: Radiation Exposure Index (as provided by the fluoroscopic device): 12.90 mGy air Kerma Please see intraoperative findings for further detail. IMPRESSION: Intraoperative fluoroscopic guidance for proximal right femoral fracture fixation. Electronically Signed   By: Gregory Gibbs M.D.   On: 08/12/2022 11:57   DG C-Arm 1-60 Min-No Report  Result Date: 08/12/2022 Fluoroscopy was utilized by the requesting physician.  No radiographic interpretation.   DG C-Arm 1-60 Min-No Report  Result Date: 08/12/2022 Fluoroscopy was utilized by the requesting physician.  No radiographic interpretation.    Cardiac Studies     Patient Profile     77 y.o. male with a history of CAD, chronic systolic heart failure, hyperlipidemia, orthostatic hypotension, CKD stage III, hypothyroidism who are consulted for evaluation of CAD/preop evaluation   Assessment & Plan    R hip fracture: occurred after falling off room while attempting to clean gutter.  Underwent surgery yesterday morning. -Continue ASA 81 mg daily -Restart brilinta when safe per ortho -- AM labs are pending. I discussed the plan and importance of restarting brilinta as soon as possible with Dr. Hanley Gibbs. We will sign off.   CAD s/p recent DES to prox LAD 06/03/22.  Will need to minimize time off DAPT given recent coronary stent.  Continuing aspirin 80 mg.  Restart brilinta as soon as reasonably  safe.  H/o syncope: patient had bradycardia down to 30s and positive orthostatic hypotension that lead to syncope in May, beta blocker removed at the time. 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  Euvolemic on exam   Hyperlipidemia: continue lipitor 80mg  daily   CKD stage III: Cr at baseline  Anemia: Hgb 8.5 this morning; received 1 units PRBCs   We will sign off Please call with questions or concerns. For questions or updates, please contact Cullen Gibbs Please consult [email or web address] for contact info under      Signed, Maurice Small, MD  08/14/2022, 9:06 AM

## 2022-08-15 ENCOUNTER — Encounter (HOSPITAL_COMMUNITY): Payer: Self-pay | Admitting: Student

## 2022-08-15 DIAGNOSIS — S7291XA Unspecified fracture of right femur, initial encounter for closed fracture: Secondary | ICD-10-CM | POA: Diagnosis not present

## 2022-08-15 LAB — CBC WITH DIFFERENTIAL/PLATELET
Abs Immature Granulocytes: 0.05 10*3/uL (ref 0.00–0.07)
Basophils Absolute: 0 10*3/uL (ref 0.0–0.1)
Basophils Relative: 0 %
Eosinophils Absolute: 0.8 10*3/uL — ABNORMAL HIGH (ref 0.0–0.5)
Eosinophils Relative: 8 %
HCT: 21.9 % — ABNORMAL LOW (ref 39.0–52.0)
Hemoglobin: 7.5 g/dL — ABNORMAL LOW (ref 13.0–17.0)
Immature Granulocytes: 1 %
Lymphocytes Relative: 20 %
Lymphs Abs: 2 10*3/uL (ref 0.7–4.0)
MCH: 30.2 pg (ref 26.0–34.0)
MCHC: 34.2 g/dL (ref 30.0–36.0)
MCV: 88.3 fL (ref 80.0–100.0)
Monocytes Absolute: 0.9 10*3/uL (ref 0.1–1.0)
Monocytes Relative: 9 %
Neutro Abs: 5.9 10*3/uL (ref 1.7–7.7)
Neutrophils Relative %: 62 %
Platelets: 168 10*3/uL (ref 150–400)
RBC: 2.48 MIL/uL — ABNORMAL LOW (ref 4.22–5.81)
RDW: 12.9 % (ref 11.5–15.5)
WBC: 9.6 10*3/uL (ref 4.0–10.5)
nRBC: 0 % (ref 0.0–0.2)

## 2022-08-15 LAB — BASIC METABOLIC PANEL
Anion gap: 7 (ref 5–15)
BUN: 17 mg/dL (ref 8–23)
CO2: 26 mmol/L (ref 22–32)
Calcium: 7.5 mg/dL — ABNORMAL LOW (ref 8.9–10.3)
Chloride: 101 mmol/L (ref 98–111)
Creatinine, Ser: 1.16 mg/dL (ref 0.61–1.24)
GFR, Estimated: >60 mL/min (ref >60)
Glucose, Bld: 118 mg/dL — ABNORMAL HIGH (ref 70–99)
Potassium: 3.5 mmol/L (ref 3.5–5.1)
Sodium: 134 mmol/L — ABNORMAL LOW (ref 135–145)

## 2022-08-15 LAB — VITAMIN D 25 HYDROXY (VIT D DEFICIENCY, FRACTURES): Vit D, 25-Hydroxy: 28.9 ng/mL — ABNORMAL LOW (ref 30–100)

## 2022-08-15 LAB — MAGNESIUM: Magnesium: 1.9 mg/dL (ref 1.7–2.4)

## 2022-08-15 MED ORDER — METHOCARBAMOL 750 MG PO TABS: 750.0000 mg | ORAL_TABLET | Freq: Four times a day (QID) | ORAL | 0 refills | Status: DC | PRN

## 2022-08-15 MED ORDER — OXYCODONE HCL 10 MG PO TABS: 5.0000 mg | ORAL_TABLET | ORAL | 0 refills | Status: DC | PRN

## 2022-08-15 MED ORDER — FUROSEMIDE 10 MG/ML IJ SOLN
20.0000 mg | Freq: Once | INTRAMUSCULAR | Status: AC
  Administered 2022-08-15: 20 mg via INTRAVENOUS
  Filled 2022-08-15: qty 2

## 2022-08-15 MED ORDER — ACETAMINOPHEN 325 MG PO TABS: 650.0000 mg | ORAL_TABLET | Freq: Four times a day (QID) | ORAL | Status: AC | PRN

## 2022-08-15 MED ORDER — CELECOXIB 200 MG PO CAPS: 200.0000 mg | ORAL_CAPSULE | Freq: Two times a day (BID) | ORAL | 0 refills | Status: AC

## 2022-08-15 NOTE — [place] FL2 (Signed)
[place] MEDICAID FL2 LEVEL OF CARE FORM     IDENTIFICATION  Patient Name: Gregory Gibbs Birthdate: 28-Nov-1945 Sex: male Admission Date (Current Location): 08/10/2022  [facility] and [place] Number:  Producer, television/film/video and Address:  [facility] [place], [address], [place], [place] [address]      Provider Number: [id number]  Attending Physician Name and Address:  Glade Lloyd, MD  Relative Name and Phone Number:       Current Level of Care: Hospital Recommended Level of Care: Skilled Nursing Facility Prior Approval Number:    Date Approved/Denied:   PASRR Number: [id number]  Discharge Plan: SNF    Current Diagnoses: Patient Active Problem List   Diagnosis Date Noted   Chronic systolic heart failure (HCC) 08/11/2022   Leukocytosis 08/11/2022   Normocytic anemia 08/11/2022   Hyperlipidemia 08/11/2022   Closed fracture of right femur (HCC) 08/10/2022   Coronary artery disease status post PTCA and stenting of the proximal LAD in May 2024 06/29/2022   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   Joint pain 06/07/2022   Orthostatic hypotension 06/05/2022   Ischemic cardiomyopathy 06/05/2022   Syncope and collapse 06/04/2022   Unstable angina (HCC) 06/03/2022   Oropharyngeal candidiasis 08/13/2020   Elevated PSA 02/04/2020   Left inguinal hernia 12/11/2019   Seasonal allergies 12/07/2018   Seborrheic keratoses 12/07/2018   Infection due to 2019 novel coronavirus 08/06/2018   Mixed dyslipidemia 12/01/2017   Rhinosinusitis 11/10/2016   Chronic neck pain 11/04/2014   Gastro-esophageal reflux disease without esophagitis 03/25/2014   Acquired hypothyroidism 03/25/2014   Acute sinusitis 03/25/2014   Chronic foot pain 03/25/2014   Cough 03/25/2014   History of Helicobacter pylori infection 03/25/2014   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   Vitamin D deficiency 03/25/2014    Orientation RESPIRATION BLADDER Height & Weight      Self, Time, Situation, Place  Normal Continent Weight: 152 lb 12.5 oz (69.3 kg) Height:  5\' 9"  (175.3 cm)  BEHAVIORAL SYMPTOMS/MOOD NEUROLOGICAL BOWEL NUTRITION STATUS      Continent Diet (See DC summary)  AMBULATORY STATUS COMMUNICATION OF NEEDS Skin   Limited Assist Verbally Surgical wounds (R hip inc)                       Personal Care Assistance Level of Assistance  Bathing, Feeding, Dressing Bathing Assistance: Limited assistance Feeding assistance: Limited assistance Dressing Assistance: Limited assistance     Functional Limitations Info  Sight, Hearing, Speech Sight Info: Adequate Hearing Info: Adequate Speech Info: Adequate    SPECIAL CARE FACTORS FREQUENCY  PT (By licensed PT), OT (By licensed OT)     PT Frequency: 5x week OT Frequency: 5x week            Contractures Contractures Info: Not present    Additional Factors Info  Code Status, Allergies Code Status Info: Full Allergies Info: NKA           Current Medications (08/15/2022):  This is the current hospital active medication list Current Facility-Administered Medications  Medication Dose Route Frequency Provider Last Rate Last Admin   acetaminophen (TYLENOL) tablet 650 mg  650 mg Oral Q6H West Bali, PA-C   650 mg at 08/15/22 0102   aspirin chewable tablet 81 mg  81 mg Oral Daily West Bali, PA-C   81 mg at 08/15/22 0947   atorvastatin (LIPITOR) tablet 80 mg  80 mg Oral Daily West Bali, PA-C   80  mg at 08/15/22 0947   celecoxib (CELEBREX) capsule 200 mg  200 mg Oral BID Vernetta Honey, PA-C   200 mg at 08/15/22 6962   docusate sodium (COLACE) capsule 100 mg  100 mg Oral BID West Bali, PA-C   100 mg at 08/15/22 9528   ferrous sulfate tablet 325 mg  325 mg Oral TID PC West Bali, PA-C   325 mg at 08/15/22 1149   HYDROmorphone (DILAUDID) injection 0.5-1 mg  0.5-1 mg Intravenous Q4H PRN West Bali, PA-C   1 mg at 08/14/22 4132   levothyroxine (SYNTHROID)  tablet 50 mcg  50 mcg Oral q morning West Bali, PA-C   50 mcg at 08/15/22 4401   methocarbamol (ROBAXIN) tablet 750 mg  750 mg Oral Q6H PRN Vernetta Honey, PA-C   750 mg at 08/15/22 0272   Or   methocarbamol (ROBAXIN) 1,000 mg in dextrose 5 % 100 mL IVPB  1,000 mg Intravenous Q6H PRN McBane, Jerald Kief, PA-C       metoCLOPramide (REGLAN) tablet 5-10 mg  5-10 mg Oral Q8H PRN Sharon Seller, Sarah A, PA-C       Or   metoCLOPramide (REGLAN) injection 5-10 mg  5-10 mg Intravenous Q8H PRN Thyra Breed A, PA-C       morphine (PF) 2 MG/ML injection 2 mg  2 mg Intravenous Q4H PRN West Bali, PA-C   2 mg at 08/10/22 1639   ondansetron (ZOFRAN) tablet 4 mg  4 mg Oral Q6H PRN West Bali, PA-C       Or   ondansetron (ZOFRAN) injection 4 mg  4 mg Intravenous Q6H PRN Thyra Breed A, PA-C       oxyCODONE (Oxy IR/ROXICODONE) immediate release tablet 5-10 mg  5-10 mg Oral Q4H PRN West Bali, PA-C   10 mg at 08/15/22 1149   pantoprazole (PROTONIX) EC tablet 40 mg  40 mg Oral Daily Thyra Breed A, PA-C   40 mg at 08/15/22 0946   polyethylene glycol (MIRALAX / GLYCOLAX) packet 17 g  17 g Oral Daily PRN West Bali, PA-C       ticagrelor (BRILINTA) tablet 90 mg  90 mg Oral BID Glade Lloyd, MD   90 mg at 08/15/22 5366     Discharge Medications: Please see discharge summary for a list of discharge medications.  Relevant Imaging Results:  Relevant Lab Results:   Additional Information SS# [id number], [place]

## 2022-08-15 NOTE — Plan of Care (Signed)

## 2022-08-15 NOTE — TOC Initial Note (Signed)
Transition of Care [facility]) - Initial/Assessment Note    Patient Details  Name: Gregory Gibbs MRN: [id number] Date of Birth: Sep 07, 1945  Transition of Care [facility]) CM/SW Contact:    Carley Hammed, LCSW Phone Number: 08/15/2022, 2:23 PM  Clinical Narrative:                 CSW met with pt and family at bedside to discuss SNF rec. Per pt, he is interested in [facility] of [address] He declines further referrals being send other than to the [facility] location. Pt states his spouse said if [facility] cannot offer, then she will take him home for rehab with [facility]. Family noted agreement with this plan, and he has good supports at home. CSW to send out approved referrals and contact facilities for review. TOC will continue to follow for DC needs.   Expected Discharge Plan: Skilled Nursing Facility Barriers to Discharge: SNF Pending bed offer, Insurance Authorization   Patient Goals and CMS Choice Patient states their goals for this hospitalization and ongoing recovery are:: Pt states he would like to go to a [facility] facility or go home to complete rehab. CMS Medicare.gov Compare Post Acute Care list provided to:: Patient Choice offered to / list presented to : Patient      Expected Discharge Plan and Services     Post Acute Care Choice: Skilled Nursing Facility Living arrangements for the past 2 months: Single Family Home                                      Prior Living Arrangements/Services Living arrangements for the past 2 months: Single Family Home Lives with:: Spouse Patient language and need for interpreter reviewed:: Yes Do you feel safe going back to the place where you live?: Yes      Need for Family Participation in Patient Care: Yes (Comment) Care giver support system in place?: Yes (comment)   Criminal Activity/Legal Involvement Pertinent to Current Situation/Hospitalization: No - Comment as needed  Activities of Daily Living      Permission Sought/Granted Permission sought  to share information with : Family Supports, Oceanographer granted to share information with : Yes, Verbal Permission Granted  Share Information with NAME: Alona Bene  Permission granted to share info w AGENCY: [facility]  Permission granted to share info w Relationship: Spouse     Emotional Assessment Appearance:: Appears stated age Attitude/Demeanor/Rapport: Engaged Affect (typically observed): Appropriate Orientation: : Oriented to Self, Oriented to Place, Oriented to  Time, Oriented to Situation Alcohol / Substance Use: Not Applicable Psych Involvement: No (comment)  Admission diagnosis:  Closed fracture of right femur (HCC) [S72.91XA] Closed displaced intertrochanteric fracture of right femur, initial encounter (HCC) [S72.141A] Patient Active Problem List   Diagnosis Date Noted   Chronic systolic heart failure (HCC) 08/11/2022   Leukocytosis 08/11/2022   Normocytic anemia 08/11/2022   Hyperlipidemia 08/11/2022   Closed fracture of right femur (HCC) 08/10/2022   Coronary artery disease status post PTCA and stenting of the proximal LAD in May 2024 06/29/2022   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   Joint pain 06/07/2022   Orthostatic hypotension 06/05/2022   Ischemic cardiomyopathy 06/05/2022   Syncope and collapse 06/04/2022   Unstable angina (HCC) 06/03/2022   Oropharyngeal candidiasis 08/13/2020   Elevated PSA 02/04/2020   Left inguinal hernia 12/11/2019   Seasonal allergies 12/07/2018   Seborrheic keratoses 12/07/2018   Infection due to  2019 novel coronavirus 08/06/2018   Mixed dyslipidemia 12/01/2017   Rhinosinusitis 11/10/2016   Chronic neck pain 11/04/2014   Gastro-esophageal reflux disease without esophagitis 03/25/2014   Acquired hypothyroidism 03/25/2014   Acute sinusitis 03/25/2014   Chronic foot pain 03/25/2014   Cough 03/25/2014   History of Helicobacter pylori infection 03/25/2014   Primary osteoarthritis involving multiple joints  03/25/2014   Vitamin D deficiency 03/25/2014   PCP:  Hadley Pen, MD Pharmacy:   [facility] [phone] [place], [place] - [address] AT [facility] [address] [place] [address] Phone: [phone] Fax: [phone]  [id number] - [place], [place] - [address] AT [facility] [address] [facility] [address] & [address] [place] [address] Phone: [phone] Fax: [phone]     Social Determinants of Health (SDOH) Social History: SDOH Screenings   Tobacco Use: Medium Risk (08/12/2022)   SDOH Interventions:     Readmission Risk Interventions     No data to display

## 2022-08-15 NOTE — Care Management Important Message (Signed)
Important Message  Patient Details  Name: DAMARR BLASKE MRN: [id number] Date of Birth: 26-Nov-1945   Medicare Important Message Given:  Yes     Sherilyn Banker 08/15/2022, 2:26 PM

## 2022-08-15 NOTE — Progress Notes (Signed)
PROGRESS NOTE    Gregory Gibbs  ION:[id number] DOB: September 28, 1945 DOA: 08/10/2022 PCP: Hadley Pen, MD   Brief Narrative:  77 y.o. male with past medical history of CAD with recent stenting few months ago, hypothyroidism, and GERD presented with a mechanical fall and was found to have comminuted fracture of the proximal right femur and was admitted under orthopedic service.  He underwent closed reduction of femur fracture with placement of lower extremity traction pin on 08/10/2022.  TRH was consulted on 08/11/2022 for medical management.  Subsequently, care was transferred to [facility] service.  Assessment & Plan:   Closed comminuted fracture of the proximal right femur after a mechanical fall -Management as per orthopedics team. -underwent closed reduction of femur fracture with placement of lower extremity traction pin on 08/10/2022.  -Underwent cephalomedullary nailing of right intertrochanteric/subtrochanteric femur fracture today by orthopedics. -Wound care/pain management as per orthopedics. -PT recommending SNF placement.  TOC consulted. -Patient is currently medically stable for discharge to SNF  Leukocytosis -Possibly reactive.  Improving.  Monitor.  No signs of infection at this time.  Labs pending for today.  Thrombocytopenia -Questionable cause.  Monitor intermittently.  Normocytic anemia/possible acute blood loss anemia -Possibly from femur fracture.  Status post 2 units packed red cells transfusion during this hospitalization, had 1 unit transfused on 08/13/2022 for hemoglobin of 6.7.  Hemoglobin 7.7 on 08/14/2022.  Pending for today.  Monitor.  CAD status post recent LAD stenting Chronic systolic heart failure Hypertension Hyperlipidemia -Cardiology signed off on 08/14/2022.  Currently on aspirin and statin.  Brilinta to be resumed on 08/14/2022 after discussion with cardiology.  CKD stage IIIa -Creatinine currently stable.  Monitor intermittently.  Labs pending  today.  Hypothyroidism -Continue levothyroxine  Subjective: Patient seen and examined at bedside.  Continues to have intermittent right hip pain.  Complains of some shortness of breath and groin swelling.  No fever, vomiting, chest pain or shortness of breath reported. Objective: Vitals:   08/14/22 2006 08/15/22 0500 08/15/22 0633 08/15/22 0727  BP: (!) 93/51  (!) 119/58 119/62  Pulse: 74  84 80  Resp: 18  16 16   Temp: 98.6 F (37 C)  97.8 F (36.6 C) 98.5 F (36.9 C)  TempSrc: Oral  Oral Oral  SpO2: 95%  97% 94%  Weight:  69.3 kg    Height:        Intake/Output Summary (Last 24 hours) at 08/15/2022 0818 Last data filed at 08/14/2022 1058 Gross per 24 hour  Intake --  Output 600 ml  Net -600 ml   Filed Weights   08/13/22 0455 08/14/22 0500 08/15/22 0500  Weight: 68.3 kg 68.6 kg 69.3 kg    Examination:  General: On room air.  No distress.  Elderly male lying in bed. respiratory: Decreased breath sounds at bases bilaterally with some crackles CVS: Currently rate controlled; S1-S2 heard  abdominal: Soft, nontender, slightly distended, no organomegaly; normal bowel sounds are heard  extremities: Trace lower extremity edema; no cyanosis   Data Reviewed: I have personally reviewed following labs and imaging studies  CBC: Recent Labs  Lab 08/10/22 1539 08/10/22 1614 08/11/22 0603 08/12/22 0011 08/13/22 0036 08/13/22 0806 08/14/22 0101  WBC 15.4*  --  14.0* 16.5* 14.0*  --  12.0*  NEUTROABS 11.7*  --   --   --  10.9*  --  7.3  HGB 10.6*   < > 9.7* 7.7* 6.7* 8.5* 7.7*  HCT 31.6*   < > 29.0* 23.3* 19.6* 24.8* 23.2*  MCV 88.8  --  87.3 88.3 86.3  --  91.7  PLT 192  --  161 145* 127*  --  127*   < > = values in this interval not displayed.   Basic Metabolic Panel: Recent Labs  Lab 08/10/22 1539 08/10/22 1614 08/10/22 2338 08/11/22 0323 08/12/22 0011 08/13/22 0036 08/14/22 1215  NA 138   < > 139 138 137 135 138  K 3.0*   < > 4.4 4.5 4.4 4.2 3.9  CL 112*    < > 105 109 107 105 104  CO2 19*  --   --  21* 21* 23 25  GLUCOSE 108*   < > 117* 129* 126* 122* 92  BUN 9   < > 13 12 15 13 13   CREATININE 1.14   < > 1.20 1.38* 1.20 1.32* 1.17  CALCIUM 6.6*  --   --  8.1* 7.8* 7.6* 7.8*  MG  --   --   --   --   --  1.8 1.8   < > = values in this interval not displayed.   GFR: Estimated Creatinine Clearance: 53.5 mL/min (by C-G formula based on SCr of 1.17 mg/dL). Liver Function Tests: Recent Labs  Lab 08/10/22 1539  AST 36  ALT 25  ALKPHOS 49  BILITOT 1.2  PROT 4.4*  ALBUMIN 2.7*   No results for input(s): "LIPASE", "AMYLASE" in the last 168 hours. No results for input(s): "AMMONIA" in the last 168 hours. Coagulation Profile: No results for input(s): "INR", "PROTIME" in the last 168 hours. Cardiac Enzymes: No results for input(s): "CKTOTAL", "CKMB", "CKMBINDEX", "TROPONINI" in the last 168 hours. BNP (last 3 results) No results for input(s): "PROBNP" in the last 8760 hours. HbA1C: No results for input(s): "HGBA1C" in the last 72 hours. CBG: No results for input(s): "GLUCAP" in the last 168 hours. Lipid Profile: No results for input(s): "CHOL", "HDL", "LDLCALC", "TRIG", "CHOLHDL", "LDLDIRECT" in the last 72 hours. Thyroid Function Tests: No results for input(s): "TSH", "T4TOTAL", "FREET4", "T3FREE", "THYROIDAB" in the last 72 hours. Anemia Panel: No results for input(s): "VITAMINB12", "FOLATE", "FERRITIN", "TIBC", "IRON", "RETICCTPCT" in the last 72 hours. Sepsis Labs: No results for input(s): "PROCALCITON", "LATICACIDVEN" in the last 168 hours.  Recent Results (from the past 240 hour(s))  Surgical pcr screen     Status: None   Collection Time: 08/10/22  5:20 PM   Specimen: Nasal Mucosa; Nasal Swab  Result Value Ref Range Status   MRSA, PCR NEGATIVE NEGATIVE Final   Staphylococcus aureus NEGATIVE NEGATIVE Final    Comment: (NOTE) The Xpert SA Assay (FDA approved for NASAL specimens in patients 60 years of age and older), is one  component of a comprehensive surveillance program. It is not intended to diagnose infection nor to guide or monitor treatment. Performed at [facility], [address], [place], [place] [address]          Radiology Studies: No results found.      Scheduled Meds:  acetaminophen  650 mg Oral Q6H   aspirin  81 mg Oral Daily   atorvastatin  80 mg Oral Daily   celecoxib  200 mg Oral BID   docusate sodium  100 mg Oral BID   ferrous sulfate  325 mg Oral TID PC   levothyroxine  50 mcg Oral q morning   pantoprazole  40 mg Oral Daily   ticagrelor  90 mg Oral BID   Continuous Infusions:  methocarbamol (ROBAXIN) IV  Glade Lloyd, MD [facility] 08/15/2022, 8:18 AM

## 2022-08-15 NOTE — Progress Notes (Signed)
Occupational Therapy Treatment Patient Details Name: Gregory Gibbs MRN: [id number] DOB: 12/14/1945 Today's Date: 08/15/2022   History of present illness 77 yo. Male presenting on 08/10/22 after a 59ft fall from his roof, brought to the ED and found to have R femur fx. He is now s/p IM nailing of right femur fracture on 08/11/22 and is WBAT post op.  Pt with significant PMH of inflammatory arthritis, hypothyroidism, ischemic cardiomyopathy, orthostatic hypotension, and unstable angina.   OT comments  Pt in bed upon therapy arrival and agreeable to participate in OT treatment session. Pt premedicated prior to therapy arrival. Session focused on RLE ROM exercises, functional mobility, and standing tolerance. With increased time, pt was able to transition to sitting EOB and standing using RW. Pt verbalized that he felt better than yesterday and was agreeable to walk into bathroom to complete oral care. While standing at sink. Pt experienced orthostatic BP symptoms and was assisted to the recliner and reclined supine.  BP supine after standing: 94/52. After a few minutes supine: 119/57 Provided personal fan and OT provided total A to don abdominal binder. Provided pt/family education on orthostatic BP, what can cause it and management strategies. Pt verbalize understanding. Nursing and MD were informed of pt's BP during session.       If plan is discharge home, recommend the following:  A little help with bathing/dressing/bathroom;Assistance with cooking/housework;Assist for transportation;A little help with walking and/or transfers;Help with stairs or ramp for entrance   Equipment Recommendations  Tub/shower bench       Precautions / Restrictions Precautions Precautions: Fall Precaution Comments: orthostatic BP - abdominal binder Restrictions Weight Bearing Restrictions: Yes RLE Weight Bearing: Weight bearing as tolerated       Mobility Bed Mobility Overal bed mobility: Needs Assistance Bed  Mobility: Supine to Sit     Supine to sit: Min assist, HOB elevated     General bed mobility comments: Provided physical assist to off load RLE to allow pt to bring LE towards EOB. Pt was able to scoot hips towards EOB without difficulty.    Transfers Overall transfer level: Needs assistance Equipment used: Rolling walker (2 wheels) Transfers: Sit to/from Stand, Bed to chair/wheelchair/BSC Sit to Stand: Min assist, From elevated surface     Step pivot transfers: Min assist     General transfer comment: VC provided for hand placement during RW management. VC to squeeze glutes and push into RW handles with both arms to bring trunk upright.     Balance Overall balance assessment: Needs assistance Sitting-balance support: Bilateral upper extremity supported, Feet supported Sitting balance-Leahy Scale: Fair Sitting balance - Comments: sitting EOB   Standing balance support: Bilateral upper extremity supported, During functional activity, Reliant on assistive device for balance Standing balance-Leahy Scale: Poor Standing balance comment: requires support from either RW or bathroom counter while standing            ADL either performed or assessed with clinical judgement   ADL Overall ADL's : Needs assistance/impaired Eating/Feeding: Set up;Sitting   Grooming: Oral care;Standing Grooming Details (indicate cue type and reason): standing at sink to clean dentures           Cognition Arousal: Alert Behavior During Therapy: WFL for tasks assessed/performed Overall Cognitive Status: Within Functional Limits for tasks assessed                   Exercises General Exercises - Lower Extremity Ankle Circles/Pumps: AROM, Right, 10 reps, Supine Quad Sets: AROM, Right,  10 reps, Supine Gluteal Sets: AROM, 10 reps, Supine Short Arc Quad: AAROM, Right, 10 reps, Supine Heel Slides: AAROM, Right, Supine, 10 reps Straight Leg Raises: AAROM, Right, 10 reps, Supine        General Comments Pt experienced orthostatic symptoms when standing at sink. BP taken once pt transfered to recliner and his position was supine. BP: 94/52 supine after standing; 119/57 supine after a few minutes.    Pertinent Vitals/ Pain       Pain Assessment Pain Assessment: 0-10 Pain Score: 4  Pain Location: right hip/thigh/knee Pain Descriptors / Indicators: Aching, Discomfort, Guarding Pain Intervention(s): Limited activity within patient's tolerance, Monitored during session, Premedicated before session, Repositioned, Ice applied         Frequency  Min 1X/week        Progress Toward Goals  OT Goals(current goals can now be found in the care plan section)  Progress towards OT goals: Progressing toward goals            AM-PAC OT "6 Clicks" Daily Activity     Outcome Measure   Help from another person eating meals?: None Help from another person taking care of personal grooming?: A Little Help from another person toileting, which includes using toliet, bedpan, or urinal?: A Lot Help from another person bathing (including washing, rinsing, drying)?: A Lot Help from another person to put on and taking off regular upper body clothing?: None Help from another person to put on and taking off regular lower body clothing?: A Lot 6 Click Score: 17    End of Session Equipment Utilized During Treatment: Gait belt;Rolling walker (2 wheels)  OT Visit Diagnosis: Unsteadiness on feet (R26.81);Other abnormalities of gait and mobility (R26.89);Pain Pain - Right/Left: Right Pain - part of body: Hip   Activity Tolerance Treatment limited secondary to medical complications (Comment) (pt experiencing orthostatic BP)   Patient Left in chair;with call bell/phone within reach;with chair alarm set;with family/visitor present   Nurse Communication Mobility status;Other (comment) (Orthostatic BP)        Time: 0865-7846 OT Time Calculation (min): 46 min  Charges: OT General  Charges $OT Visit: 1 Visit OT Treatments $Self Care/Home Management : 8-22 mins $Therapeutic Activity: 8-22 mins $Therapeutic Exercise: 8-22 mins  Limmie Patricia, OTR/L,CBIS  Supplemental OT - [facility] and [facility] Secure Chat Preferred    , Charisse March 08/15/2022, 5:17 PM

## 2022-08-15 NOTE — Progress Notes (Signed)
Orthopaedic Trauma Progress Note  SUBJECTIVE: Doing okay today.  Pain in the right leg manageable with current medications.  Was restarted on Brilinta and has noted some shortness of breath which he states has happened in the past when for starting this medication.  States therapies are going well.  No chest pain. No nausea/vomiting. No other complaints.  Denies any numbness or tingling throughout the right lower extremity.  Tolerating diet and fluids.  No BM since admission.  Denies any abdominal pain.  Patient is interested in a SNF, would like to go to [facility] and [place] if possible  OBJECTIVE:  Vitals:   08/15/22 0633 08/15/22 0727  BP: (!) 119/58 119/62  Pulse: 84 80  Resp: 16 16  Temp: 97.8 F (36.6 C) 98.5 F (36.9 C)  SpO2: 97% 94%    General: Sitting up in bed, no acute distress Respiratory: No increased work of breathing.  Right lower extremity: Dressings removed, incisions are clean, dry, intact.  Has mild to moderate swelling throughout the thigh, to be expected.  Tenderness with palpation of the hip and throughout the thigh.  Ankle DF/PF intact.  No calf tenderness.  Endorses sensation all aspects of the foot distally.  Neurovascularly intact.  IMAGING: Stable post op imaging.   LABS:  Results for orders placed or performed during the hospital encounter of 08/10/22 (from the past 24 hour(s))  Basic metabolic panel     Status: Abnormal   Collection Time: 08/14/22 12:15 PM  Result Value Ref Range   Sodium 138 135 - 145 mmol/L   Potassium 3.9 3.5 - 5.1 mmol/L   Chloride 104 98 - 111 mmol/L   CO2 25 22 - 32 mmol/L   Glucose, Bld 92 70 - 99 mg/dL   BUN 13 8 - 23 mg/dL   Creatinine, Ser 1.61 0.61 - 1.24 mg/dL   Calcium 7.8 (L) 8.9 - 10.3 mg/dL   GFR, Estimated >09 >60 mL/min   Anion gap 9 5 - 15  Magnesium     Status: None   Collection Time: 08/14/22 12:15 PM  Result Value Ref Range   Magnesium 1.8 1.7 - 2.4 mg/dL    ASSESSMENT: Gregory Gibbs is a 77 y.o. male,  3 Days Post-Op s/p INTRAMEDULLARY NAIL RIGHT INTERTROCHANTERIC/SUBTROCHANTERIC FEMUR FRACTURE  CV/Blood loss: Acute blood loss anemia, Hgb 7.7 on 08/14/2022.  CBC pending this a.m. Hemodynamically stable  PLAN: Weightbearing: WBAT RLE ROM: Unrestricted ROM Incisional and dressing care:  Ok to leave incisions open to air Showering: Okay to begin showering getting incisions wet Orthopedic device(s): None  Pain management: Continue current multimodal regimen.  Minimize narcotics as able VTE prophylaxis: Aspirin and Brilinta , SCDs ID:  Ancef 2gm post op completed Foley/Lines:  No foley, KVO IVFs Impediments to Fracture Healing: Vitamin D level pending, will start supplementation as indicated Dispo: PT/OT evaluation ongoing, currently recommending SNF.  Patient agreeable and would like to go to [facility] if available.  Okay for discharge from ortho standpoint once cleared by medicine team and therapies  D/C recommendations: -Oxycodone and Robaxin for pain control -Continue home dose aspirin and Brilinta for DVT prophylaxis -Possible need for Vit D supplementation  Follow - up plan: 2 weeks after discharge for wound check and repeat x-rays   Contact information:  Truitt Merle MD, Thyra Breed PA-C. After hours and holidays please check [email or web address] for group call information for [facility]   Thompson Caul, PA-C [phone] (office) [email or web address]

## 2022-08-15 NOTE — Discharge Instructions (Signed)
Orthopaedic Trauma Service Discharge Instructions   General Discharge Instructions  WEIGHT BEARING STATUS:weightbearing as tolerated  RANGE OF MOTION/ACTIVITY: ok for hip and knee motion as tolerated  Wound Care: You may remove your surgical dressing. Incisions can be left open to air if there is no drainage. Once the incision is completely dry and without drainage, it may be left open to air out.  Showering may begin as early as post op day 3, (Monday 08/15/22).  Clean incision gently with soap and water.  DVT/PE prophylaxis:  Continue Brilinta  Diet: as you were eating previously.  Can use over the counter stool softeners and bowel preparations, such as Miralax, to help with bowel movements.  Narcotics can be constipating.  Be sure to drink plenty of fluids  PAIN MEDICATION USE AND EXPECTATIONS  You have likely been given narcotic medications to help control your pain.  After a traumatic event that results in an fracture (broken bone) with or without surgery, it is ok to use narcotic pain medications to help control one's pain.  We understand that everyone responds to pain differently and each individual patient will be evaluated on a regular basis for the continued need for narcotic medications. Ideally, narcotic medication use should last no more than 6-8 weeks (coinciding with fracture healing).   As a patient it is your responsibility as well to monitor narcotic medication use and report the amount and frequency you use these medications when you come to your office visit.   We would also advise that if you are using narcotic medications, you should take a dose prior to therapy to maximize you participation.  IF YOU ARE ON NARCOTIC MEDICATIONS IT IS NOT PERMISSIBLE TO OPERATE A MOTOR VEHICLE (MOTORCYCLE/CAR/TRUCK/MOPED) OR HEAVY MACHINERY DO NOT MIX NARCOTICS WITH OTHER CNS (CENTRAL NERVOUS SYSTEM) DEPRESSANTS SUCH AS ALCOHOL   STOP SMOKING OR USING NICOTINE PRODUCTS!!!!  As  discussed nicotine severely impairs your body's ability to heal surgical and traumatic wounds but also impairs bone healing.  Wounds and bone heal by forming microscopic blood vessels (angiogenesis) and nicotine is a vasoconstrictor (essentially, shrinks blood vessels).  Therefore, if vasoconstriction occurs to these microscopic blood vessels they essentially disappear and are unable to deliver necessary nutrients to the healing tissue.  This is one modifiable factor that you can do to dramatically increase your chances of healing your injury.    (This means no smoking, no nicotine gum, patches, etc)  DO NOT USE NONSTEROIDAL ANTI-INFLAMMATORY DRUGS (NSAID'S)  Using products such as Advil (ibuprofen), Aleve (naproxen), Motrin (ibuprofen) for additional pain control during fracture healing can delay and/or prevent the healing response.  If you would like to take over the counter (OTC) medication, Tylenol (acetaminophen) is ok.  However, some narcotic medications that are given for pain control contain acetaminophen as well. Therefore, you should not exceed more than 4000 mg of tylenol in a day if you do not have liver disease.  Also note that there are may OTC medicines, such as cold medicines and allergy medicines that my contain tylenol as well.  If you have any questions about medications and/or interactions please ask your doctor/PA or your pharmacist.      ICE AND ELEVATE INJURED/OPERATIVE EXTREMITY  Using ice and elevating the injured extremity above your heart can help with swelling and pain control.  Icing in a pulsatile fashion, such as 20 minutes on and 20 minutes off, can be followed.    Do not place ice directly on skin. Make  sure there is a barrier between to skin and the ice pack.    Using frozen items such as frozen peas works well as the conform nicely to the are that needs to be iced.  USE AN ACE WRAP OR TED HOSE FOR SWELLING CONTROL  In addition to icing and elevation, Ace wraps or TED  hose are used to help limit and resolve swelling.  It is recommended to use Ace wraps or TED hose until you are informed to stop.    When using Ace Wraps start the wrapping distally (farthest away from the body) and wrap proximally (closer to the body)   Example: If you had surgery on your leg or thing and you do not have a splint on, start the ace wrap at the toes and work your way up to the thigh        If you had surgery on your upper extremity and do not have a splint on, start the ace wrap at your fingers and work your way up to the upper arm  CALL THE OFFICE WITH ANY QUESTIONS OR CONCERNS: [phone]   VISIT OUR WEBSITE FOR ADDITIONAL INFORMATION: [email or web address]     Discharge Wound Care Instructions  Do NOT apply any ointments, solutions or lotions to pin sites or surgical wounds.  These prevent needed drainage and even though solutions like hydrogen peroxide kill bacteria, they also damage cells lining the pin sites that help fight infection.  Applying lotions or ointments can keep the wounds moist and can cause them to breakdown and open up as well. This can increase the risk for infection. When in doubt call the office.   If any drainage is noted, use foam dressing (mepilex) - These dressing supplies should be available at local medical supply stores [facility], [facility], etc) as well as Insurance claims handler ([facility], [facility], [employer], etc)  Once the incision is completely dry and without drainage, it may be left open to air out.  Showering may begin 36-48 hours later.  Cleaning gently with soap and water.

## 2022-08-16 DIAGNOSIS — S7291XA Unspecified fracture of right femur, initial encounter for closed fracture: Secondary | ICD-10-CM | POA: Diagnosis not present

## 2022-08-16 NOTE — Plan of Care (Addendum)
Pt is alert and oriented x 4. Up with 1 assist with walker. Vitals stable. Pt has had moderate to severe during shift. Oxy given x 2 and scheduled tylenol with prn Robaxin. Prior  to my shift pt had dizziness with ambulating. Education reinforcement provided regarding abdominal binder for orthostatic hypotension and slow transitions when rising lying sit standing.  Vitals taken more frequently this shift prior to pain meds to ensure bp is stable. Bp has been stable and pt tolerating po oxy. Encouraged pt to eat snack with oxy to decrease risk of nausea which it caused on 8/12. Right hip incision clean and dry no dressing on site. Pt tolerating heart healthy diet. Last bm 8/7. Pt reports passing gas bsx4. Colace given. Spoke to pt about taking prn mira lax but he refused a dose tonight. He stated he may take in am. Edcuated pt on scd's. Pt wore scd for approx 2 hours and then removed unable to tolerate. Pt is on brilinta and aspirin.  Problem: Pain Managment: Goal: General experience of comfort will improve Outcome: Not Progressing   Problem: Education: Goal: Knowledge of General Education information will improve Description: Including pain rating scale, medication(s)/side effects and non-pharmacologic comfort measures Outcome: Progressing   Problem: Health Behavior/Discharge Planning: Goal: Ability to manage health-related needs will improve Outcome: Progressing   Problem: Clinical Measurements: Goal: Ability to maintain clinical measurements within normal limits will improve Outcome: Progressing Goal: Will remain free from infection Outcome: Progressing Goal: Diagnostic test results will improve Outcome: Progressing Goal: Respiratory complications will improve Outcome: Progressing Goal: Cardiovascular complication will be avoided Outcome: Progressing   Problem: Activity: Goal: Risk for activity intolerance will decrease Outcome: Progressing   Problem: Nutrition: Goal: Adequate  nutrition will be maintained Outcome: Progressing   Problem: Coping: Goal: Level of anxiety will decrease Outcome: Progressing   Problem: Elimination: Goal: Will not experience complications related to bowel motility Outcome: Progressing Goal: Will not experience complications related to urinary retention Outcome: Progressing   Problem: Safety: Goal: Ability to remain free from injury will improve Outcome: Progressing   Problem: Skin Integrity: Goal: Risk for impaired skin integrity will decrease Outcome: Progressing

## 2022-08-16 NOTE — Progress Notes (Signed)
Physical Therapy Treatment Patient Details Name: Gregory Gibbs MRN: [id number] DOB: [birth year] Today's Date: 08/16/2022   History of Present Illness 77 yo. Male presenting on 08/10/22 after a 37ft fall from his roof, brought to the ED and found to have R femur fx. He is now s/p IM nailing of right femur fracture on 08/11/22 and is WBAT post op.  Pt with significant PMH of inflammatory arthritis, hypothyroidism, ischemic cardiomyopathy, orthostatic hypotension, and unstable angina.    PT Comments  Pt is very hesitant to get up with therapy today given symptomatic drop in BP with walking to sink with OT yesterday. Started session with bed level ROM exercises especially of R hip. Pt requiring mod A for coming to sit EoB, once there abdominal binder placed and allowed pt to acclimate to sitting. Pt able to power up to RW with increased time and then step to recliner with min A. Pt with sweating and lightheadedness, unable to tolerate standing BP and sat down in recliner. Pt educated on BP elevation techniques, and encouraged to sit up in recliner to improve tolerance. Requested TED hose. D/c plans remain appropriate given pt decreased ROM and mobility. PT will continue to follow acutely.  Orthostatic BPs  Supine 109/62  Sitting 100/51  Sitting after 5 min 108/57  Standing (unable to obtain due to lightheadedness) immediately after sitting  91/50  Sitting after 3 min 103/58       If plan is discharge home, recommend the following: A lot of help with walking and/or transfers;A lot of help with bathing/dressing/bathroom;Assistance with cooking/housework;Assist for transportation;Help with stairs or ramp for entrance   Can travel by private vehicle     Yes  Equipment Recommendations  Rolling walker (2 wheels);Wheelchair (measurements PT);Wheelchair cushion (measurements PT);Hospital bed;BSC/3in1       Precautions / Restrictions Precautions Precautions: Fall Restrictions Weight Bearing  Restrictions: Yes RLE Weight Bearing: Weight bearing as tolerated     Mobility  Bed Mobility Overal bed mobility: Needs Assistance Bed Mobility: Supine to Sit     Supine to sit: Mod assist, Used rails, HOB elevated     General bed mobility comments: cues for sequencing and use of rails, requires modA for pad scoot of hips to EoB, R hip very stiff even after A/AROM prior to mobilization    Transfers Overall transfer level: Needs assistance Equipment used: Rolling walker (2 wheels) Transfers: Sit to/from Stand Sit to Stand: Min assist           General transfer comment: cues for hand placement and elevated bed height, min A for steadying as pt requires increased time for coming to fully upright and sliding R LE under him    Ambulation/Gait Ambulation/Gait assistance: Min assist Gait Distance (Feet): 3 Feet Assistive device: Rolling walker (2 wheels) Gait Pattern/deviations: Step-to pattern, Decreased stance time - right, Decreased dorsiflexion - right, Decreased weight shift to right, Antalgic Gait velocity: decreased Gait velocity interpretation: <1.31 ft/sec, indicative of household ambulator   General Gait Details: Verbal cues for walker use, sequencing/technique. Heavy reliance through arms on walker. Difficulty clearing R foot         Balance Overall balance assessment: Needs assistance Sitting-balance support: Feet supported, No upper extremity supported, Bilateral upper extremity supported Sitting balance-Leahy Scale: Good     Standing balance support: Bilateral upper extremity supported Standing balance-Leahy Scale: Poor Standing balance comment: needs support from RW mostly due to pain in R LE  Cognition Arousal: Alert Behavior During Therapy: WFL for tasks assessed/performed Overall Cognitive Status: Within Functional Limits for tasks assessed                                          Exercises  Total Joint Exercises Ankle Circles/Pumps: AROM, Both, 20 reps Quad Sets: AROM, Right, 10 reps Gluteal Sets: AROM, Both, 10 reps, Standing Heel Slides: AAROM, Right, 15 reps, Seated (with washcloth) Hip ABduction/ADduction: AAROM, Right, 10 reps, Supine General Exercises - Lower Extremity Ankle Circles/Pumps: AROM, Right, 20 reps, Supine Quad Sets: AROM, Right, 10 reps, Supine Gluteal Sets: Both, 10 reps, Supine Short Arc Quad: AAROM, Right, 10 reps, Supine Heel Slides: AAROM, Right, 5 reps, Supine Hip ABduction/ADduction: AAROM, Right, 5 reps, Supine Straight Leg Raises: AAROM, Right, 10 reps, Supine    General Comments General comments (skin integrity, edema, etc.): Pt continues to have orthostatic hypotension with coming to standing, with abdominal binder in place. Have asked for addition of TED hose as well, given pt's need for increased mobility. Pt has fear of falling with walking as he has passed out in the past from BP drop. Educated pt on isometric pull of interlocked hands in attempt to increase BP in standing      Pertinent Vitals/Pain Pain Assessment Pain Assessment: Faces Faces Pain Scale: Hurts little more Pain Location: R hip/thigh Pain Descriptors / Indicators: Aching, Discomfort, Grimacing, Guarding, Moaning, Tightness Pain Intervention(s): Monitored during session, Premedicated before session, Repositioned     PT Goals (current goals can now be found in the care plan section) Acute Rehab PT Goals Patient Stated Goal: to get back up on his feet and active again (maybe no more roofs). PT Goal Formulation: With patient Time For Goal Achievement: 08/27/22 Potential to Achieve Goals: Good Progress towards PT goals: Not progressing toward goals - comment (limited by orthostatic hypotension)    Frequency    Min 1X/week       AM-PAC PT "6 Clicks" Mobility   Outcome Measure  Help needed turning from your back to your side while in a flat bed without using  bedrails?: A Little Help needed moving from lying on your back to sitting on the side of a flat bed without using bedrails?: A Little Help needed moving to and from a bed to a chair (including a wheelchair)?: A Little Help needed standing up from a chair using your arms (e.g., wheelchair or bedside chair)?: A Little Help needed to walk in hospital room?: Total Help needed climbing 3-5 steps with a railing? : Total 6 Click Score: 14    End of Session Equipment Utilized During Treatment: Gait belt Activity Tolerance: Treatment limited secondary to medical complications (Comment) (orthostatic hypotension) Patient left: with call bell/phone within reach;in chair;with chair alarm set Nurse Communication: Mobility status PT Visit Diagnosis: Muscle weakness (generalized) (M62.81);Difficulty in walking, not elsewhere classified (R26.2);Pain Pain - Right/Left: Right Pain - part of body: Hip     Time: 2683-4196 PT Time Calculation (min) (ACUTE ONLY): 39 min  Charges:    $Gait Training: 8-22 mins $Therapeutic Exercise: 8-22 mins $Therapeutic Activity: 8-22 mins PT General Charges $$ ACUTE PT VISIT: 1 Visit                      B. Beverely Risen PT, DPT Acute Rehabilitation Services Please use secure chat or  Call Office [phone]  Elon Alas Fleet 08/16/2022, 9:52 AM

## 2022-08-16 NOTE — Progress Notes (Signed)
PROGRESS NOTE    Gregory Gibbs  ZOX:[id number] DOB: 1945/05/31 DOA: 08/10/2022 PCP: Hadley Pen, MD   Brief Narrative:  77 y.o. male with past medical history of CAD with recent stenting few months ago, hypothyroidism, and GERD presented with a mechanical fall and was found to have comminuted fracture of the proximal right femur and was admitted under orthopedic service.  He underwent closed reduction of femur fracture with placement of lower extremity traction pin on 08/10/2022.  TRH was consulted on 08/11/2022 for medical management.  Subsequently, care was transferred to [facility] service.  PT recommended SNF placement.  Assessment & Plan:   Closed comminuted fracture of the proximal right femur after a mechanical fall -Management as per orthopedics team. -underwent closed reduction of femur fracture with placement of lower extremity traction pin on 08/10/2022.  -Underwent cephalomedullary nailing of right intertrochanteric/subtrochanteric femur fracture today by orthopedics. -Wound care/pain management as per orthopedics. -PT recommending SNF placement.  TOC following. -Patient is currently medically stable for discharge to SNF  Leukocytosis -Possibly reactive.  Improved.  Thrombocytopenia -Improved  Normocytic anemia/possible acute blood loss anemia -Possibly from femur fracture.  Status post 2 units packed red cells transfusion during this hospitalization, had 1 unit transfused on 08/13/2022 for hemoglobin of 6.7.  Hemoglobin 7.5 on 08/15/2022.  Pending for today.  Monitor.  CAD status post recent LAD stenting Chronic systolic heart failure Hypertension Hyperlipidemia -Cardiology signed off on 08/14/2022.  Currently on aspirin and statin.  Brilinta resumed on 08/14/2022 after discussion with cardiology.  CKD stage IIIa -Creatinine currently stable.  Monitor intermittently.  Labs pending today.  Hypothyroidism -Continue levothyroxine  Subjective: Patient seen and examined at  bedside.  Still has intermittent right hip pain.  No chest pain, fever or vomiting reported.   Objective: Vitals:   08/15/22 1948 08/15/22 2240 08/16/22 0500 08/16/22 0531  BP: 107/62 (!) 120/56  (!) 112/56  Pulse: 86 91  95  Resp: 20   17  Temp: 98.6 F (37 C) 98.8 F (37.1 C)  98.8 F (37.1 C)  TempSrc: Oral Oral  Oral  SpO2: 98% 96%  95%  Weight:   77.7 kg   Height:        Intake/Output Summary (Last 24 hours) at 08/16/2022 0803 Last data filed at 08/16/2022 0000 Gross per 24 hour  Intake 240 ml  Output --  Net 240 ml   Filed Weights   08/14/22 0500 08/15/22 0500 08/16/22 0500  Weight: 68.6 kg 69.3 kg 77.7 kg    Examination:  General: No acute distress.  Currently on room air.  Elderly male lying in bed. respiratory: Bilateral decreased breath sounds at bases with scattered crackles  CVS: S1 and S2 normally heard; currently rate controlled  abdominal: Soft, nontender, distended mildly; no organomegaly; bowel sounds normally heard  extremities: No clubbing; mild lower extremity edema present   Data Reviewed: I have personally reviewed following labs and imaging studies  CBC: Recent Labs  Lab 08/10/22 1539 08/10/22 1614 08/11/22 0603 08/12/22 0011 08/13/22 0036 08/13/22 0806 08/14/22 0101 08/15/22 0910  WBC 15.4*  --  14.0* 16.5* 14.0*  --  12.0* 9.6  NEUTROABS 11.7*  --   --   --  10.9*  --  7.3 5.9  HGB 10.6*   < > 9.7* 7.7* 6.7* 8.5* 7.7* 7.5*  HCT 31.6*   < > 29.0* 23.3* 19.6* 24.8* 23.2* 21.9*  MCV 88.8  --  87.3 88.3 86.3  --  91.7 88.3  PLT  192  --  161 145* 127*  --  127* 168   < > = values in this interval not displayed.   Basic Metabolic Panel: Recent Labs  Lab 08/11/22 0323 08/12/22 0011 08/13/22 0036 08/14/22 1215 08/15/22 0910  NA 138 137 135 138 134*  K 4.5 4.4 4.2 3.9 3.5  CL 109 107 105 104 101  CO2 21* 21* 23 25 26   GLUCOSE 129* 126* 122* 92 118*  BUN 12 15 13 13 17   CREATININE 1.38* 1.20 1.32* 1.17 1.16  CALCIUM 8.1* 7.8* 7.6*  7.8* 7.5*  MG  --   --  1.8 1.8 1.9   GFR: Estimated Creatinine Clearance: 55 mL/min (by C-G formula based on SCr of 1.16 mg/dL). Liver Function Tests: Recent Labs  Lab 08/10/22 1539  AST 36  ALT 25  ALKPHOS 49  BILITOT 1.2  PROT 4.4*  ALBUMIN 2.7*   No results for input(s): "LIPASE", "AMYLASE" in the last 168 hours. No results for input(s): "AMMONIA" in the last 168 hours. Coagulation Profile: No results for input(s): "INR", "PROTIME" in the last 168 hours. Cardiac Enzymes: No results for input(s): "CKTOTAL", "CKMB", "CKMBINDEX", "TROPONINI" in the last 168 hours. BNP (last 3 results) No results for input(s): "PROBNP" in the last 8760 hours. HbA1C: No results for input(s): "HGBA1C" in the last 72 hours. CBG: No results for input(s): "GLUCAP" in the last 168 hours. Lipid Profile: No results for input(s): "CHOL", "HDL", "LDLCALC", "TRIG", "CHOLHDL", "LDLDIRECT" in the last 72 hours. Thyroid Function Tests: No results for input(s): "TSH", "T4TOTAL", "FREET4", "T3FREE", "THYROIDAB" in the last 72 hours. Anemia Panel: No results for input(s): "VITAMINB12", "FOLATE", "FERRITIN", "TIBC", "IRON", "RETICCTPCT" in the last 72 hours. Sepsis Labs: No results for input(s): "PROCALCITON", "LATICACIDVEN" in the last 168 hours.  Recent Results (from the past 240 hour(s))  Surgical pcr screen     Status: None   Collection Time: 08/10/22  5:20 PM   Specimen: Nasal Mucosa; Nasal Swab  Result Value Ref Range Status   MRSA, PCR NEGATIVE NEGATIVE Final   Staphylococcus aureus NEGATIVE NEGATIVE Final    Comment: (NOTE) The Xpert SA Assay (FDA approved for NASAL specimens in patients 78 years of age and older), is one component of a comprehensive surveillance program. It is not intended to diagnose infection nor to guide or monitor treatment. Performed at [facility], [address], [place], [place] [address]          Radiology Studies: No results  found.      Scheduled Meds:  acetaminophen  650 mg Oral Q6H   aspirin  81 mg Oral Daily   atorvastatin  80 mg Oral Daily   celecoxib  200 mg Oral BID   docusate sodium  100 mg Oral BID   ferrous sulfate  325 mg Oral TID PC   levothyroxine  50 mcg Oral q morning   pantoprazole  40 mg Oral Daily   ticagrelor  90 mg Oral BID   Continuous Infusions:  methocarbamol (ROBAXIN) IV            Glade Lloyd, MD [facility] 08/16/2022, 8:03 AM

## 2022-08-16 NOTE — TOC Progression Note (Signed)
Transition of Care [facility]) - Progression Note    Patient Details  Name: Gregory Gibbs MRN: [id number] Date of Birth: 04/13/1945  Transition of Care [facility]) CM/SW Contact  Carley Hammed, LCSW Phone Number: 08/16/2022, 12:00 PM  Clinical Narrative:     CSW was notified by [facility] in  that they could extend a bed to pt. Pt advised and agreeable, states he will update family. Authorization pending at this time. Agreeable to ambulance transfer at DC. TOC will continue to follow for DC needs.   Expected Discharge Plan: Skilled Nursing Facility Barriers to Discharge: SNF Pending bed offer, Insurance Authorization  Expected Discharge Plan and Services     Post Acute Care Choice: Skilled Nursing Facility Living arrangements for the past 2 months: Single Family Home                                       Social Determinants of Health (SDOH) Interventions SDOH Screenings   Tobacco Use: Medium Risk (08/12/2022)    Readmission Risk Interventions     No data to display

## 2022-08-17 DIAGNOSIS — S7291XA Unspecified fracture of right femur, initial encounter for closed fracture: Secondary | ICD-10-CM | POA: Diagnosis not present

## 2022-08-17 LAB — HEMOGLOBIN AND HEMATOCRIT, BLOOD
HCT: 23.2 % — ABNORMAL LOW (ref 39.0–52.0)
Hemoglobin: 8 g/dL — ABNORMAL LOW (ref 13.0–17.0)

## 2022-08-17 LAB — GLUCOSE, CAPILLARY: Glucose-Capillary: 104 mg/dL — ABNORMAL HIGH (ref 70–99)

## 2022-08-17 LAB — PREPARE RBC (CROSSMATCH)

## 2022-08-17 MED ORDER — VITAMIN D (ERGOCALCIFEROL) 1.25 MG (50000 UNIT) PO CAPS
50000.0000 [IU] | ORAL_CAPSULE | ORAL | Status: DC
  Administered 2022-08-17: 50000 [IU] via ORAL
  Filled 2022-08-17 (×2): qty 1

## 2022-08-17 MED ORDER — SODIUM CHLORIDE 0.9% IV SOLUTION: Freq: Once | INTRAVENOUS | Status: AC

## 2022-08-17 MED ORDER — VITAMIN D 25 MCG (1000 UNIT) PO TABS: 1000.0000 [IU] | ORAL_TABLET | Freq: Every day | ORAL | Status: DC

## 2022-08-17 NOTE — TOC Progression Note (Addendum)
Transition of Care [facility]) - Progression Note    Patient Details  Name: Gregory Gibbs MRN: [id number] Date of Birth: October 04, 1945  Transition of Care [facility]) CM/SW Contact  Carley Hammed, LCSW Phone Number: 08/17/2022, 12:28 PM  Clinical Narrative:    Monia Pouch has issued an attempt to deny SNF coverage. They are offering a P2P due by today @ 4:30 pm P# [phone] opt#3 the appeals # [phone]  Medical team notified. TOC will continue to follow for DC needs.  1:15 Monia Pouch will approve for pt to go to SNF for rehab. Plan to DC tomorrow per MD. [facility] will continue to follow for DC needs.   Expected Discharge Plan: Skilled Nursing Facility Barriers to Discharge: SNF Pending bed offer, Insurance Authorization  Expected Discharge Plan and Services     Post Acute Care Choice: Skilled Nursing Facility Living arrangements for the past 2 months: Single Family Home                                       Social Determinants of Health (SDOH) Interventions SDOH Screenings   Tobacco Use: Medium Risk (08/12/2022)    Readmission Risk Interventions     No data to display

## 2022-08-17 NOTE — Progress Notes (Signed)
Critical HGB 6.8 [facility] J. Daniel notified

## 2022-08-17 NOTE — Progress Notes (Signed)
Occupational Therapy Treatment Patient Details Name: Gregory Gibbs MRN: [id number] DOB: 1945-04-27 Today's Date: 08/17/2022   History of present illness 77 yo. Male presenting on 08/10/22 after a 8ft fall from his roof, brought to the ED and found to have R femur fx. He is now s/p IM nailing of right femur fracture on 08/11/22 and is WBAT post op.  Pt with significant PMH of inflammatory arthritis, hypothyroidism, ischemic cardiomyopathy, orthostatic hypotension, and unstable angina.   OT comments  Pt in bed upon therapy arrival. Family members present and visiting in room. Pt recently was up with family and bathed. Pt agreeable to work with OT. Session focused on bed mobility, UE strength and endurance during mobility, activity tolerance and endurance while monitoring BP levels. (See below in note for BP readings). TED hose and abdominal binder used during session. Pt continues to demonstrate decreased strength and endurance causing him to have limited activity tolerance and require increased assistance to complete BADL tasks. Pt continues to be appropriate for continued inpatient follow up therapy, <3 hours/day when discharged prior to returning home. OT will continue to follow acutely. Progress OOB activity next session if able while monitoring BP. Take green resistance band for UB strengthening seated.        If plan is discharge home, recommend the following:  A little help with bathing/dressing/bathroom;Assistance with cooking/housework;Assist for transportation;A little help with walking and/or transfers;Help with stairs or ramp for entrance   Equipment Recommendations  Tub/shower bench       Precautions / Restrictions Precautions Precautions: Fall Precaution Comments: orthostatic BP - abdominal binder and TEDs Restrictions Weight Bearing Restrictions: Yes RLE Weight Bearing: Weight bearing as tolerated       Mobility Bed Mobility Overal bed mobility: Needs Assistance Bed  Mobility: Supine to Sit, Sit to Supine     Supine to sit: Supervision, HOB elevated, Used rails Sit to supine: Min assist, Used rails   General bed mobility comments: HOB lowered during sit to supine. Once pt in long sitting, HOB was elevated.  Assist provided with BLE to bring them back on top of bed.    Transfers Overall transfer level:  (defered transfer this session d/t BP levels and changes that occured while seated on EOB)        Balance Overall balance assessment: Needs assistance Sitting-balance support: Bilateral upper extremity supported, Feet supported Sitting balance-Leahy Scale: Fair Sitting balance - Comments: sitting EOB      ADL either performed or assessed with clinical judgement   ADL Overall ADL's : Needs assistance/impaired        Lower Body Dressing: Total assistance;Bed level Lower Body Dressing Details (indicate cue type and reason): TEDs and non-slip socks donned by OT.      General ADL Comments: Family in room. Daughter reports that he was just up and bathed. Pt reports that he was bathed with total assist. Encouraged him to do as much as he can for himself to work on building his endurance and activity tolerance.      Cognition Arousal: Alert Behavior During Therapy: WFL for tasks assessed/performed Overall Cognitive Status: Within Functional Limits for tasks assessed                     General Comments BP monitored during session. Supine (no TEDs or binder): 112/64, chair position in bed (TEDs only on): 127/61, sitting EOB (binder and TEDs on): 135/41 EOB (after sitting ~3 minutes): 112/74    Pertinent Vitals/ Pain  Pain Assessment Pain Assessment: 0-10 Pain Score: 6  Pain Location: R hip/thigh after mobility and sitting on EOB Pain Descriptors / Indicators: Discomfort, Grimacing, Aching Pain Intervention(s): Monitored during session, Repositioned, Patient requesting pain meds-RN notified         Frequency  Min 1X/week         Progress Toward Goals  OT Goals(current goals can now be found in the care plan section)  Progress towards OT goals: Not progressing toward goals - comment (BP issues and acute blood loss anemia limiting progress this session)            AM-PAC OT "6 Clicks" Daily Activity     Outcome Measure   Help from another person eating meals?: None Help from another person taking care of personal grooming?: A Little Help from another person toileting, which includes using toliet, bedpan, or urinal?: A Lot Help from another person bathing (including washing, rinsing, drying)?: A Lot Help from another person to put on and taking off regular upper body clothing?: None Help from another person to put on and taking off regular lower body clothing?: A Lot 6 Click Score: 17    End of Session Equipment Utilized During Treatment: Other (comment) (abdominal binder, TEDs)  OT Visit Diagnosis: Unsteadiness on feet (R26.81);Other abnormalities of gait and mobility (R26.89);Pain Pain - Right/Left: Right Pain - part of body: Hip   Activity Tolerance Patient limited by fatigue;Other (comment) (limited by orthostatic BP changes)   Patient Left in bed;with call bell/phone within reach;with family/visitor present;with nursing/sitter in room   Nurse Communication Patient requests pain meds        Time: 4098-1191 OT Time Calculation (min): 28 min  Charges: OT General Charges $OT Visit: 1 Visit OT Treatments $Therapeutic Activity: 23-37 mins  Limmie Patricia, OTR/L,CBIS  Supplemental OT - [facility] and [facility] Secure Chat Preferred    , Charisse March 08/17/2022, 3:36 PM

## 2022-08-17 NOTE — Progress Notes (Addendum)
[facility] PROGRESS NOTE  Gregory Gibbs (DOB: 1945-02-02) WNU:[id number] PCP: Hadley Pen, MD  Brief Narrative: Gregory Gibbs is a 77 y.o. male with a history of CAD s/p proximal LAD DES 06/03/2022, hypothyroidism, GERD who presented to the ED on 08/10/2022 after a mechanical fall found to have a comminuted proximal right femur fracture. Orthopedic surgery admitted him, performed closed reduction with traction pin 8/7 and the patient's care was subsequently transferred to the hospitalist service. Postoperative course has been complicated by acute blood loss anemia requiring 2u RBCs 8/10 and an additional 1u 8/14 as well as orthostatic hypotension. Aspirin and brilinta initially held, were restarted per cardiology recommendations in light of such a recent PCI.   He awaits placement at SNF for rehabilitation once he is medically ready, possibly 8/15. Insurance has denied this, and I have requested a peer to peer this afternoon.  Subjective: Pain controlled, he's able to roll better than he was a couple days ago. He's very unsteady and weak. Eager to continue PT.  Objective: BP (!) 111/52   Pulse 82   Temp 98.2 F (36.8 C) (Oral)   Resp 18   Ht 5\' 9"  (1.753 m)   Wt 77.6 kg   SpO2 100%   BMI 25.26 kg/m   Gen: WDWN elderly male in no distress Pulm: Clear, nonlabored  CV: RRR, no MRG GI: Soft, NT, ND, +BS  Neuro: Alert and oriented. No new focal deficits. Ext: Right thigh with some clean dry and intact wounds without erythema. Mild ecchymosis around superiormost wound. Some induration without fluctuance. Diffusely tender though compartment is soft. Distal LE has intact sensation and motor function.  Skin: Hematocele noted, does not feel indurated, does not extended beyond scrotum. Painless.   Assessment & Plan: Principal Problem:   Closed fracture of right femur (HCC) Active Problems:   Leukocytosis   Normocytic anemia   Coronary artery disease status post PTCA and  stenting of the proximal LAD in May 2024   Chronic systolic heart failure (HCC)   Acquired hypothyroidism   Hyperlipidemia  Closed comminuted proximal right femur fracture after mechanical fall:  - s/p closed reduction and traction pin placement 8/7 by Dr. Charlann Boxer then IM nail 8/9 by Dr. Jena Gauss. - VTE ppx with ASA, brilinta, SCDs - Pain control as ordered.  Hyponatremia: Mild, asymptomatic.  - Regular diet, remove fluid restriction  Vitamin D deficiency: 25OH vitamin D is 28.90.  - Start 50k units weekly, recheck in 8 weeks.   Thrombocytopenia: Now resolved.   Acute blood loss anemia: Pt also developed painless hematocele. This has reduced in size over the past 24 hours, his leg swelling and pain has also not increased in the past 24 hours. Suspect he's had fracture-related bleeding and EBL of at IM nail. Anticipate cessation of bleeding with improvement in platelet count as well.  - Transfuse 1u RBCs and recheck H/H. If not >8g/dl, would repeat 1u this PM (given orthostasis and CAD with one lesion managed medically).   CAD s/p DES to proximal LAD culprit lesion 06/03/2022 by Dr. [place]: No anginal complaints at this time.  - Given this and his orthostasis, would transfuse to a goal of 8g/dl.  - Restarted ASA and brilinta to protect stent, will continue unless ongoing bleeding is noted.  - Not on BB due to symptomatic bradycardia/syncope in May.   Stage IIIa CKD: Stable.   HFrEF: LVEF 45%, remains euvolemic clinically.  - BP would not sustain any GDMT, no need  for diuretic currently but will monitor with transfusions.   Hypothyroidism:  - Continue stable dose synthroid.  - Given orthostasis and previous symptomatic bradycardia, we'll check TSH.   HLD:  - Continue statin  Reactive leukocytosis: Resolved. No fever.  Tyrone Nine, MD [facility] [email or web address] 08/17/2022, 12:18 PM

## 2022-08-17 NOTE — Plan of Care (Signed)
  Problem: Education: Goal: Knowledge of General Education information will improve Description: Including pain rating scale, medication(s)/side effects and non-pharmacologic comfort measures Outcome: Progressing   Problem: Health Behavior/Discharge Planning: Goal: Ability to manage health-related needs will improve Outcome: Progressing   Problem: Clinical Measurements: Goal: Ability to maintain clinical measurements within normal limits will improve Outcome: Progressing Goal: Will remain free from infection Outcome: Progressing Goal: Diagnostic test results will improve Outcome: Progressing Goal: Respiratory complications will improve Outcome: Completed/Met Goal: Cardiovascular complication will be avoided Outcome: Progressing   Problem: Clinical Measurements: Goal: Will remain free from infection Outcome: Progressing   Problem: Clinical Measurements: Goal: Diagnostic test results will improve Outcome: Progressing   Problem: Clinical Measurements: Goal: Respiratory complications will improve Outcome: Completed/Met   Problem: Clinical Measurements: Goal: Cardiovascular complication will be avoided Outcome: Progressing   Problem: Activity: Goal: Risk for activity intolerance will decrease Outcome: Progressing   Problem: Nutrition: Goal: Adequate nutrition will be maintained Outcome: Progressing   Problem: Coping: Goal: Level of anxiety will decrease Outcome: Progressing   Problem: Elimination: Goal: Will not experience complications related to bowel motility Outcome: Not Progressing   Problem: Elimination: Goal: Will not experience complications related to bowel motility Outcome: Not Progressing Miralax given Problem: Pain Managment: Goal: General experience of comfort will improve Outcome: Progressing   Problem: Safety: Goal: Ability to remain free from injury will improve Outcome: Progressing

## 2022-08-18 ENCOUNTER — Ambulatory Visit: Payer: Medicare HMO | Admitting: Cardiology

## 2022-08-18 DIAGNOSIS — M6281 Muscle weakness (generalized): Secondary | ICD-10-CM | POA: Diagnosis not present

## 2022-08-18 DIAGNOSIS — I2510 Atherosclerotic heart disease of native coronary artery without angina pectoris: Secondary | ICD-10-CM | POA: Diagnosis not present

## 2022-08-18 DIAGNOSIS — S72001D Fracture of unspecified part of neck of right femur, subsequent encounter for closed fracture with routine healing: Secondary | ICD-10-CM | POA: Diagnosis not present

## 2022-08-18 DIAGNOSIS — I5022 Chronic systolic (congestive) heart failure: Secondary | ICD-10-CM | POA: Diagnosis not present

## 2022-08-18 DIAGNOSIS — D62 Acute posthemorrhagic anemia: Secondary | ICD-10-CM | POA: Diagnosis not present

## 2022-08-18 DIAGNOSIS — E039 Hypothyroidism, unspecified: Secondary | ICD-10-CM | POA: Diagnosis not present

## 2022-08-18 DIAGNOSIS — S7291XA Unspecified fracture of right femur, initial encounter for closed fracture: Secondary | ICD-10-CM | POA: Diagnosis not present

## 2022-08-18 DIAGNOSIS — R2689 Other abnormalities of gait and mobility: Secondary | ICD-10-CM | POA: Diagnosis not present

## 2022-08-18 DIAGNOSIS — K219 Gastro-esophageal reflux disease without esophagitis: Secondary | ICD-10-CM | POA: Diagnosis not present

## 2022-08-18 DIAGNOSIS — R262 Difficulty in walking, not elsewhere classified: Secondary | ICD-10-CM | POA: Diagnosis not present

## 2022-08-18 DIAGNOSIS — Z79899 Other long term (current) drug therapy: Secondary | ICD-10-CM | POA: Diagnosis not present

## 2022-08-18 DIAGNOSIS — R2681 Unsteadiness on feet: Secondary | ICD-10-CM | POA: Diagnosis not present

## 2022-08-18 DIAGNOSIS — D72829 Elevated white blood cell count, unspecified: Secondary | ICD-10-CM | POA: Diagnosis not present

## 2022-08-18 DIAGNOSIS — D649 Anemia, unspecified: Secondary | ICD-10-CM | POA: Diagnosis not present

## 2022-08-18 DIAGNOSIS — E785 Hyperlipidemia, unspecified: Secondary | ICD-10-CM | POA: Diagnosis not present

## 2022-08-18 DIAGNOSIS — Z7401 Bed confinement status: Secondary | ICD-10-CM | POA: Diagnosis not present

## 2022-08-18 DIAGNOSIS — S7291XD Unspecified fracture of right femur, subsequent encounter for closed fracture with routine healing: Secondary | ICD-10-CM | POA: Diagnosis not present

## 2022-08-18 DIAGNOSIS — G8918 Other acute postprocedural pain: Secondary | ICD-10-CM | POA: Diagnosis not present

## 2022-08-18 DIAGNOSIS — R531 Weakness: Secondary | ICD-10-CM | POA: Diagnosis not present

## 2022-08-18 DIAGNOSIS — R54 Age-related physical debility: Secondary | ICD-10-CM | POA: Diagnosis not present

## 2022-08-18 LAB — BASIC METABOLIC PANEL
Anion gap: 7 (ref 5–15)
BUN: 19 mg/dL (ref 8–23)
CO2: 25 mmol/L (ref 22–32)
Calcium: 7.7 mg/dL — ABNORMAL LOW (ref 8.9–10.3)
Chloride: 101 mmol/L (ref 98–111)
Creatinine, Ser: 1.21 mg/dL (ref 0.61–1.24)
GFR, Estimated: >60 mL/min (ref >60)
Glucose, Bld: 119 mg/dL — ABNORMAL HIGH (ref 70–99)
Potassium: 4.1 mmol/L (ref 3.5–5.1)
Sodium: 133 mmol/L — ABNORMAL LOW (ref 135–145)

## 2022-08-18 LAB — TYPE AND SCREEN
ABO/RH(D): A NEG
Antibody Screen: NEGATIVE
Unit division: 0

## 2022-08-18 LAB — BPAM RBC
Blood Product Expiration Date: 202408162359
ISSUE DATE / TIME: 202408140842
Unit Type and Rh: 600

## 2022-08-18 LAB — HEMOGLOBIN AND HEMATOCRIT, BLOOD
HCT: 24.3 % — ABNORMAL LOW (ref 39.0–52.0)
Hemoglobin: 8.2 g/dL — ABNORMAL LOW (ref 13.0–17.0)

## 2022-08-18 LAB — TSH: TSH: 8.861 u[IU]/mL — ABNORMAL HIGH (ref 0.350–4.500)

## 2022-08-18 MED ORDER — POLYETHYLENE GLYCOL 3350 17 G PO PACK: 17.0000 g | PACK | Freq: Two times a day (BID) | ORAL | Status: DC

## 2022-08-18 MED ORDER — FERROUS SULFATE 325 (65 FE) MG PO TBEC: 325.0000 mg | DELAYED_RELEASE_TABLET | Freq: Every day | ORAL | Status: AC

## 2022-08-18 MED ORDER — LEVOTHYROXINE SODIUM 50 MCG PO TABS: 62.5000 ug | ORAL_TABLET | Freq: Every day | ORAL | Status: DC

## 2022-08-18 MED ORDER — DOCUSATE SODIUM 100 MG PO CAPS: 100.0000 mg | ORAL_CAPSULE | Freq: Two times a day (BID) | ORAL | Status: DC

## 2022-08-18 MED ORDER — ONDANSETRON HCL 4 MG PO TABS: 4.0000 mg | ORAL_TABLET | Freq: Four times a day (QID) | ORAL | Status: DC | PRN

## 2022-08-18 MED ORDER — LEVOTHYROXINE SODIUM 25 MCG PO TABS: 62.5000 ug | ORAL_TABLET | Freq: Every morning | ORAL | Status: AC

## 2022-08-18 MED ORDER — SENNA 8.6 MG PO TABS
2.0000 | ORAL_TABLET | Freq: Once | ORAL | Status: AC
  Administered 2022-08-18: 17.2 mg via ORAL
  Filled 2022-08-18: qty 2

## 2022-08-18 MED ORDER — VITAMIN D (ERGOCALCIFEROL) 1.25 MG (50000 UNIT) PO CAPS: 50000.0000 [IU] | ORAL_CAPSULE | ORAL | Status: DC

## 2022-08-18 MED ORDER — SENNA 8.6 MG PO TABS: 2.0000 | ORAL_TABLET | Freq: Two times a day (BID) | ORAL | Status: DC

## 2022-08-18 NOTE — Progress Notes (Signed)
Physical Therapy Treatment Patient Details Name: Gregory Gibbs MRN: [id number] DOB: 1945-01-26 Today's Date: 08/18/2022   History of Present Illness 77 yo. Male presenting on 08/10/22 after a 47ft fall from his roof, brought to the ED and found to have R femur fx. He is now s/p IM nailing of right femur fracture on 08/11/22 and is WBAT post op.  Pt with significant PMH of inflammatory arthritis, hypothyroidism, ischemic cardiomyopathy, orthostatic hypotension, and unstable angina.    PT Comments  Pt supine in bed on entry, with complaints of minor nausea after eating breakfast. Pt agreeable to therapy. Monitored BP throughout session and although pt has significant drop in BP in standing even with TED hose and abdominal binder, does not feel lightheaded or syncopal. Pt with increased pain with movement to EoB/ RN notified and pain medication administered during session. Pt is modA for bed mobility, and min A for transfer and short ambulation to recliner. D/c plans remain appropriate at this time. PT will continue to follow acutely.  Orthostatic BPs  Supine 116/70 (84)  Sitting with TED hose donned 122/51 (71)  Sitting after 3 min with TED hose and abdominal binder in place  124/60 (78)  Standing 98/63 (64)  Seated in recliner  110/55 (71)       If plan is discharge home, recommend the following: A lot of help with walking and/or transfers;A lot of help with bathing/dressing/bathroom;Assistance with cooking/housework;Assist for transportation;Help with stairs or ramp for entrance   Can travel by private vehicle     Yes  Equipment Recommendations  Rolling walker (2 wheels);Wheelchair (measurements PT);Wheelchair cushion (measurements PT);Hospital bed;BSC/3in1       Precautions / Restrictions Precautions Precautions: Fall Restrictions Weight Bearing Restrictions: Yes RLE Weight Bearing: Weight bearing as tolerated     Mobility  Bed Mobility Overal bed mobility: Needs Assistance Bed  Mobility: Supine to Sit     Supine to sit: Mod assist, Used rails, HOB elevated     General bed mobility comments: cues for sequencing, mod A for pad scoot of hips to EoB and managment of R LE to the floor    Transfers Overall transfer level: Needs assistance Equipment used: Rolling walker (2 wheels) Transfers: Sit to/from Stand Sit to Stand: Min assist           General transfer comment: cues for hand placement and elevated bed height, min A for steadying as pt requires increased time for coming to fully upright and sliding R LE under him    Ambulation/Gait Ambulation/Gait assistance: Min assist Gait Distance (Feet): 3 Feet Assistive device: Rolling walker (2 wheels) Gait Pattern/deviations: Step-to pattern, Decreased stance time - right, Decreased dorsiflexion - right, Decreased weight shift to right, Antalgic Gait velocity: decreased Gait velocity interpretation: <1.31 ft/sec, indicative of household ambulator   General Gait Details: vc for use of RW to offweight R LE for movement and for support during movement of L LE, pt noted to have increased breath holding and encouraged deep breathing, SoB is one of his complaints         Balance Overall balance assessment: Needs assistance Sitting-balance support: Feet supported, No upper extremity supported, Bilateral upper extremity supported Sitting balance-Leahy Scale: Good Sitting balance - Comments: sitting EOB   Standing balance support: Bilateral upper extremity supported Standing balance-Leahy Scale: Poor Standing balance comment: needs support from RW mostly due to pain in R LE  Cognition Arousal: Alert Behavior During Therapy: WFL for tasks assessed/performed Overall Cognitive Status: Within Functional Limits for tasks assessed                                          Exercises General Exercises - Lower Extremity Ankle Circles/Pumps: AROM, Right, 20  reps, Supine Quad Sets: AROM, Right, 10 reps, Supine Gluteal Sets: Both, 10 reps, Supine Short Arc Quad: AAROM, Right, 10 reps, Supine Heel Slides: AAROM, Right, 5 reps, Supine Hip ABduction/ADduction: AAROM, Right, 5 reps, Supine Straight Leg Raises: AAROM, Right, 10 reps, Supine    General Comments General comments (skin integrity, edema, etc.): TED hose donned in supine, abdominal binder donned in seated, BP monitored throughout session      Pertinent Vitals/Pain Pain Assessment Pain Assessment: 0-10 Pain Score: 9  Faces Pain Scale: Hurts little more Pain Location: R hip/thigh Pain Descriptors / Indicators: Aching, Discomfort, Grimacing, Guarding, Moaning, Tightness, Sharp, Throbbing Pain Intervention(s): Monitored during session, Repositioned, Patient requesting pain meds-RN notified, RN gave pain meds during session     PT Goals (current goals can now be found in the care plan section) Acute Rehab PT Goals Patient Stated Goal: to get back up on his feet and active again (maybe no more roofs). PT Goal Formulation: With patient Time For Goal Achievement: 08/27/22 Potential to Achieve Goals: Good Progress towards PT goals: Progressing toward goals    Frequency    Min 1X/week       AM-PAC PT "6 Clicks" Mobility   Outcome Measure  Help needed turning from your back to your side while in a flat bed without using bedrails?: A Little Help needed moving from lying on your back to sitting on the side of a flat bed without using bedrails?: A Little Help needed moving to and from a bed to a chair (including a wheelchair)?: A Little Help needed standing up from a chair using your arms (e.g., wheelchair or bedside chair)?: A Little Help needed to walk in hospital room?: Total Help needed climbing 3-5 steps with a railing? : Total 6 Click Score: 14    End of Session Equipment Utilized During Treatment: Gait belt Activity Tolerance: Patient limited by pain (orthostatic  hypotension) Patient left: with call bell/phone within reach;in chair;with family/visitor present Nurse Communication: Mobility status PT Visit Diagnosis: Muscle weakness (generalized) (M62.81);Difficulty in walking, not elsewhere classified (R26.2);Pain Pain - Right/Left: Right Pain - part of body: Hip     Time: 9147-8295 PT Time Calculation (min) (ACUTE ONLY): 39 min  Charges:    $Gait Training: 8-22 mins $Therapeutic Exercise: 8-22 mins $Therapeutic Activity: 8-22 mins PT General Charges $$ ACUTE PT VISIT: 1 Visit                      B. Beverely Risen PT, DPT Acute Rehabilitation Services Please use secure chat or  Call Office [phone]    [facility] 08/18/2022, 9:35 AM

## 2022-08-18 NOTE — Plan of Care (Signed)

## 2022-08-18 NOTE — Discharge Summary (Addendum)
Physician Discharge Summary   Patient: Gregory Gibbs MRN: [id number] DOB: 02/28/1945  Admit date:     08/10/2022  Discharge date: 08/18/22  Discharge Physician: Tyrone Nine   PCP: Hadley Pen, MD   Recommendations at discharge:  Follow up with cardiology in the next week. Consider deescalation from brilinta if dyspnea (with normal pulmonary exam and oxygen saturations) continues. Monitor for constipation. Aggressive bowel regimen prescribed in light of narcotic analgesia for postoperative pain and iron supplementation for acute blood loss anemia.  Follow up with orthopedics for post-discharge evaluation.  Follow up with PCP after SNF discharge.  Recommend rechecking CBC and BMP regularly at SNF.  Recommend repeat TSH in 4 weeks. Synthroid dose increased > 62. with elevated TSH and bradycardia/orthostasis. See incidental pulmonary nodule below.  Discharge Diagnoses: Principal Problem:   Closed fracture of right femur [facility]) Active Problems:   Leukocytosis   Normocytic anemia   Coronary artery disease status post PTCA and stenting of the proximal LAD in May 2024   Chronic systolic heart failure [facility])   Acquired hypothyroidism   Hyperlipidemia  Hospital Course: Gregory Gibbs is a 77 y.o. male with a history of CAD s/p proximal LAD DES 06/03/2022, hypothyroidism, GERD who presented to the ED on 08/10/2022 after a mechanical fall found to have a comminuted proximal right femur fracture. Orthopedic surgery admitted him, performed closed reduction with traction pin 8/7 and the patient's care was subsequently transferred to the hospitalist service. Postoperative course has been complicated by acute blood loss anemia requiring 2u RBCs 8/10 and an additional 1u 8/14 as well as orthostatic hypotension which has improved. Aspirin and brilinta initially held, were restarted per cardiology recommendations in light of such a recent PCI. They will follow up to discuss changing from  brilinta due to dyspnea.    He awaits placement at [facility] for rehabilitation. Please see below for details.  Assessment and Plan: Closed comminuted proximal right femur fracture after mechanical fall:  - s/p closed reduction and traction pin placement 8/7 by Dr. Charlann Boxer then IM nail 8/9 by Dr. Jena Gauss. - VTE ppx with ASA, brilinta. - Pain control as ordered. Antiemetic as well.    Hyponatremia: Mild, asymptomatic.    Vitamin D deficiency: 25OH vitamin D is 28.90.  - Started 50k units weekly on 8/14, recheck in 8 weeks.    Thrombocytopenia: Now resolved.    Acute blood loss anemia: CT showed large intramuscular hematoma surrounding the proximal RIGHT femur fracture. EBL of at IM nail. Had low platelets that have rebounded. Transfused total of 3u RBCs with repeat stable >8g/dl.  Swelling in thigh is improving. Had hematocele which is also improving.  - Hgb 8.2g/dl on day of discharge. Suggest recheck in next few days. - Continue iron, but deescalate to once daily dosing due to GI upset and constipation.   CAD s/p DES to proximal LAD culprit lesion 06/03/2022 by Dr. [place]: No anginal complaints at this time.  - Given this and his orthostasis, would transfuse to a goal of 8g/dl.  - Restarted ASA and brilinta to protect stent, f/u with cardiology - Not on BB due to symptomatic bradycardia/syncope in May.   Dyspnea: Normal pulmonary exam, no infiltrate or edema on CT chest. No hypoxemia. Suspect this is related to brilinta as it coincides with its administration.  - Defer to cardiology when brilinta can be changed. He has a follow up appointment with them 8/19   Constipation: Abd exam is benign.  -  Decrease iron supplementation intensity (was started on TID on 8/8), deescalate narcotics when able - Soap suds enema today - Continue BID senna, ducosate, miralax, and prn suppositories.   Stage IIIa CKD: Stable. CrCl 52.106ml/min.    HFrEF: LVEF 45%, remains euvolemic clinically.  - BP would  not sustain any GDMT, no need for diuretic    Hypothyroidism:  - Continue stable dose synthroid.  - Given orthostasis and previous symptomatic bradycardia, we'll check TSH.    HLD:  - Continue statin   Reactive leukocytosis: Resolved. No fever.  Left pulmonary nodule:  Left solid pulmonary nodule measuring 7 mm. Per Fleischner Society Guidelines, recommend a non-contrast Chest CT at 6-12 months. If patient is high risk for malignancy, consider an additional non-contrast Chest CT at 18-24 months. If patient is low risk for malignancy, non-contrast Chest CT at 18-24 months is optional.  Consultants: Orthopedic surgery, cardiology Procedures performed:   08/10/22 RIGHT PLACEMENT OF RIGHT LOWER EXTREMITY TRACTION PIN Durene Romans, MD    08/12/22 INTRAMEDULLARY (IM) NAIL INTERTROCHANTERIC Haddix, Gillie Manners, MD  Disposition: Skilled nursing facility Diet recommendation: Heart healthy DISCHARGE MEDICATION: Allergies as of 08/18/2022   No Known Allergies      Medication List     TAKE these medications    acetaminophen 325 MG tablet Commonly known as: TYLENOL Take 2 tablets (650 mg total) by mouth every 6 (six) hours as needed for mild pain, headache or fever.   aspirin EC 81 MG tablet Take 1 tablet (81 mg total) by mouth daily. Swallow whole.   atorvastatin 80 MG tablet Commonly known as: LIPITOR Take 1 tablet (80 mg total) by mouth daily.   celecoxib 200 MG capsule Commonly known as: CELEBREX Take 1 capsule (200 mg total) by mouth 2 (two) times daily for 7 days.   Cyanocobalamin 1000 MCG Tbcr Take 1 tablet by mouth daily.   docusate sodium 100 MG capsule Commonly known as: COLACE Take 1 capsule (100 mg total) by mouth 2 (two) times daily.   ferrous sulfate 325 (65 FE) MG EC tablet Take 1 tablet (325 mg total) by mouth daily with breakfast.   levothyroxine 25 MCG tablet Commonly known as: SYNTHROID Take 2.5 tablets (62.5 mcg total) by mouth every morning. What  changed:  medication strength how much to take   methocarbamol 750 MG tablet Commonly known as: ROBAXIN Take 1 tablet (750 mg total) by mouth every 6 (six) hours as needed for muscle spasms.   multivitamin with minerals Tabs tablet Take 1 tablet by mouth daily.   nitroGLYCERIN 0.4 MG SL tablet Commonly known as: NITROSTAT Place 1 tablet (0.4 mg total) under the tongue every 5 (five) minutes x 3 doses as needed for chest pain.   ondansetron 4 MG tablet Commonly known as: ZOFRAN Take 1 tablet (4 mg total) by mouth every 6 (six) hours as needed for nausea.   Oxycodone HCl 10 MG Tabs Take 0.5-1 tablets (5-10 mg total) by mouth every 4 (four) hours as needed for moderate pain or severe pain (5 mg moderate pain, 10 mg severe pain).   pantoprazole 40 MG tablet Commonly known as: PROTONIX Take 40 mg by mouth daily.   polyethylene glycol 17 g packet Commonly known as: MIRALAX / GLYCOLAX Take 17 g by mouth 2 (two) times daily.   senna 8.6 MG Tabs tablet Commonly known as: SENOKOT Take 2 tablets (17.2 mg total) by mouth in the morning and at bedtime.   ticagrelor 90 MG Tabs tablet Commonly known  as: BRILINTA Take 1 tablet (90 mg total) by mouth 2 (two) times daily.   Vitamin D (Ergocalciferol) 1.25 MG (50000 UNIT) Caps capsule Commonly known as: DRISDOL Take 1 capsule (50,000 Units total) by mouth every 7 (seven) days. Start taking on: August 24, 2022        Follow-up Information     Haddix, Gillie Manners, MD. Schedule an appointment as soon as possible for a visit in 2 week(s).   Specialty: Orthopedic Surgery Why: for wound check and repeat x-rays Contact information: [address] [place] [address] [phone]         Georgeanna Lea, MD Follow up on 08/22/2022.   Specialty: Cardiology Why: at 3:40 pm Contact information: [address] [place] [address] [phone]         Hadley Pen, MD Follow up.   Specialty: Family Medicine Contact  information: [address] [place] [address] [phone]                Discharge Exam: Filed Weights   08/16/22 0500 08/17/22 0500 08/18/22 0500  Weight: 77.7 kg 77.6 kg 78.5 kg  BP (!) 110/55 (BP Location: Right Arm)   Pulse 78   Temp 98.1 F (36.7 C) (Oral)   Resp 17   Ht 5\' 9"  (1.753 m)   Wt 78.5 kg   SpO2 98%   BMI 25.56 kg/m   No distress sitting in chair Clear throughout with great air movement, nonlabored RRR, no MRG. Compression stockings on. Soft, NT, full without distention and active bowel sounds.  Condition at discharge: stable  The results of significant diagnostics from this hospitalization (including imaging, microbiology, ancillary and laboratory) are listed below for reference.   Imaging Studies: DG FEMUR PORT, MIN 2 VIEWS RIGHT  Result Date: 08/12/2022 CLINICAL DATA:  Fracture EXAM: RIGHT FEMUR PORTABLE 2 VIEW COMPARISON:  Right femur x-ray 08/10/2022 FINDINGS: Comminuted proximal right femoral fracture involving the intratrochanteric region is again noted status post ORIF with hip screw and intramedullary nail. Alignment is near anatomic. There is no dislocation. There is lateral soft tissue swelling compatible with recent surgery. IMPRESSION: Comminuted proximal right femoral fracture status post ORIF. Alignment is near anatomic. Electronically Signed   By: Darliss Cheney M.D.   On: 08/12/2022 16:23   DG FEMUR, MIN 2 VIEWS RIGHT  Result Date: 08/12/2022 CLINICAL DATA:  Intramedullary nail intertrochanteric right femur. EXAM: RIGHT FEMUR 2 VIEWS COMPARISON:  Right femur radiographs 08/10/2022 FINDINGS: Images were performed intraoperatively without the presence of a radiologist. Redemonstration of displaced and comminuted proximal right femoral intertrochanteric and diaphyseal fracture. The patient is undergoing long cephalomedullary nail fixation. Improved fracture alignment. No hardware complication is seen. Total fluoroscopy images: 7  Total fluoroscopy time: 179 seconds Total dose: Radiation Exposure Index (as provided by the fluoroscopic device): 12.90 mGy air Kerma Please see intraoperative findings for further detail. IMPRESSION: Intraoperative fluoroscopic guidance for proximal right femoral fracture fixation. Electronically Signed   By: Neita Garnet M.D.   On: 08/12/2022 11:57   DG C-Arm 1-60 Min-No Report  Result Date: 08/12/2022 Fluoroscopy was utilized by the requesting physician.  No radiographic interpretation.   DG C-Arm 1-60 Min-No Report  Result Date: 08/12/2022 Fluoroscopy was utilized by the requesting physician.  No radiographic interpretation.   DG C-Arm 1-60 Min-No Report  Result Date: 08/11/2022 Fluoroscopy was utilized by the requesting physician.  No radiographic interpretation.   CT Head Wo Contrast  Result Date: 08/10/2022 CLINICAL DATA:  Head trauma, minor (Age >= 65y) Fall off roof cleaning gutters. EXAM: CT HEAD WITHOUT CONTRAST TECHNIQUE: Contiguous axial images were obtained from the base of the skull through the vertex without intravenous contrast. RADIATION DOSE REDUCTION: This exam was performed according to the departmental dose-optimization program which includes automated exposure control, adjustment of the mA and/or kV according to patient size and/or use of iterative reconstruction technique. COMPARISON:  None Available. FINDINGS: Brain: No intracranial hemorrhage, mass effect, or midline shift. No hydrocephalus. The basilar cisterns are patent. No evidence of territorial infarct or acute ischemia. No extra-axial or intracranial fluid collection. Vascular: No hyperdense vessel or unexpected calcification. Skull: No fracture or focal lesion. Sinuses/Orbits: Trace opacification of lower left mastoid air cells. The paranasal sinuses are clear Other: None. IMPRESSION: No acute intracranial abnormality. No skull fracture. Electronically Signed   By: Narda Rutherford M.D.   On: 08/10/2022 17:56   CT  Cervical Spine Wo Contrast  Result Date: 08/10/2022 CLINICAL DATA:  Neck trauma (Age >= 65y) Fall from roof while cleaning gutters. EXAM: CT CERVICAL SPINE WITHOUT CONTRAST TECHNIQUE: Multidetector CT imaging of the cervical spine was performed without intravenous contrast. Multiplanar CT image reconstructions were also generated. RADIATION DOSE REDUCTION: This exam was performed according to the departmental dose-optimization program which includes automated exposure control, adjustment of the mA and/or kV according to patient size and/or use of iterative reconstruction technique. COMPARISON:  None Available. FINDINGS: Alignment: Normal. Skull base and vertebrae: No acute fracture. Vertebral body heights are maintained. The dens and skull base are intact. Soft tissues and spinal canal: No prevertebral fluid or swelling. No visible canal hematoma. Disc levels: Disc space narrowing and spurring at C5-C6 with adjacent Modic endplate changes. Upper chest: No acute apical findings, assessed fully on concurrent chest CT, reported separately. Other: None. IMPRESSION: Mild degenerative change in the cervical spine without acute fracture or subluxation. Electronically Signed   By: Narda Rutherford M.D.   On: 08/10/2022 17:52   CT CHEST ABDOMEN PELVIS W CONTRAST  Result Date: 08/10/2022 CLINICAL DATA:  Fall from roof cleaning gutters. EXAM: CT CHEST, ABDOMEN, AND PELVIS WITH CONTRAST TECHNIQUE: Multidetector CT imaging of the chest, abdomen and pelvis was performed following the standard protocol during bolus administration of intravenous contrast. RADIATION DOSE REDUCTION: This exam was performed according to the departmental dose-optimization program which includes automated exposure control, adjustment of the mA and/or kV according to patient size and/or use of iterative reconstruction technique. CONTRAST:  75mL OMNIPAQUE IOHEXOL 350 MG/ML SOLN COMPARISON:  None Available. FINDINGS: CT CHEST FINDINGS Cardiovascular:  No aortic injury. No mediastinal hematoma. Great vessels normal. Mediastinum/Nodes: Trachea and esophagus normal. Lungs/Pleura: No pneumothorax.  No pulmonary contusion. 7 mm lingular pulmonary nodule.  (Image 101/series 4) Musculoskeletal: No fracture CT ABDOMEN AND PELVIS FINDINGS Hepatobiliary: No hepatic laceration. Pancreas: Normal pancreas. Spleen: No splenic laceration. Adrenals/urinary tract: Adrenal glands normal. Kidneys enhance symmetrically. Bladder intact. Stomach/Bowel: No bowel injury identified.  No mesenteric fluid. Vascular/Lymphatic: Abdominal aorta normal caliber. Iliac arteries normal. Reproductive: Other: No free fluid. Musculoskeletal: Comminuted fracture of the proximal RIGHT femur diaphysis and extending into intertrochanteric femur. The femoral head is located and varus angulated. No acetabular fracture identified. Intramuscular hematoma surrounds the comminuted femur fracture on the RIGHT. No evidence of active arterial bleeding. No pelvic fracture or sacral fracture identified. IMPRESSION: CHEST: 1. No thoracic injury. 2. Left solid pulmonary nodule measuring 7 mm. Per Fleischner Society Guidelines, recommend a non-contrast Chest CT at 6-12 months. If patient is high risk  for malignancy, consider an additional non-contrast Chest CT at 18-24 months. If patient is low risk for malignancy, non-contrast Chest CT at 18-24 months is optional. These guidelines do not apply to immunocompromised patients and patients with cancer. Follow up in patients with significant comorbidities as clinically warranted. For lung cancer screening, adhere to Lung-RADS guidelines. Reference: Radiology. 2017; 284(1):228-43. PELVIS: 1. Comminuted fracture of the proximal RIGHT femur diaphysis and extending into the intertrochanteric femur. Femoral head is located and varus angulated. 2. Large intramuscular hematoma surrounds the proximal RIGHT femur fracture. 3. No evidence of solid organ injury. Electronically  Signed   By: Genevive Bi M.D.   On: 08/10/2022 17:47   DG Femur Min 2 Views Right  Result Date: 08/10/2022 CLINICAL DATA:  Right hip pain after fall off roof. EXAM: RIGHT FEMUR 2 VIEWS COMPARISON:  None Available. FINDINGS: Severely displaced and comminuted fracture is seen involving the intertrochanteric region and proximal shaft of the right femur. IMPRESSION: Severely displaced and comminuted proximal right femoral fracture as noted above. Electronically Signed   By: Lupita Raider M.D.   On: 08/10/2022 16:19   DG Chest Portable 1 View  Result Date: 08/10/2022 CLINICAL DATA:  Fall. EXAM: PORTABLE CHEST 1 VIEW COMPARISON:  Jun 02, 2022. FINDINGS: The heart size and mediastinal contours are within normal limits. Both lungs are clear. The visualized skeletal structures are unremarkable. IMPRESSION: No active disease. Electronically Signed   By: Lupita Raider M.D.   On: 08/10/2022 16:18   DG Pelvis Portable  Result Date: 08/10/2022 CLINICAL DATA:  Right hip pain after fall off roof. EXAM: PORTABLE PELVIS 1-2 VIEWS COMPARISON:  None Available. FINDINGS: Severely displaced and comminuted fracture is seen involving the intertrochanteric region and proximal shaft of the right femur. IMPRESSION: Severely displaced and comminuted proximal right femoral fracture as noted above. Electronically Signed   By: Lupita Raider M.D.   On: 08/10/2022 16:17    Microbiology: Results for orders placed or performed during the hospital encounter of 08/10/22  Surgical pcr screen     Status: None   Collection Time: 08/10/22  5:20 PM   Specimen: Nasal Mucosa; Nasal Swab  Result Value Ref Range Status   MRSA, PCR NEGATIVE NEGATIVE Final   Staphylococcus aureus NEGATIVE NEGATIVE Final    Comment: (NOTE) The Xpert SA Assay (FDA approved for NASAL specimens in patients 33 years of age and older), is one component of a comprehensive surveillance program. It is not intended to diagnose infection nor to guide or  monitor treatment. Performed at [facility], [address] [place], [place] [address]     Labs: CBC: Recent Labs  Lab 08/13/22 0036 08/13/22 0806 08/14/22 0101 08/15/22 0910 08/16/22 0844 08/17/22 0346 08/17/22 1255 08/18/22 0043  WBC 14.0*  --  12.0* 9.6 10.9* 10.4  --   --   NEUTROABS 10.9*  --  7.3 5.9 7.1 6.8  --   --   HGB 6.7*   < > 7.7* 7.5* 7.7* 6.8* 8.0* 8.2*  HCT 19.6*   < > 23.2* 21.9* 22.5* 20.5* 23.2* 24.3*  MCV 86.3  --  91.7 88.3 87.5 88.0  --   --   PLT 127*  --  127* 168 214 261  --   --    < > = values in this interval not displayed.   Basic Metabolic Panel: Recent Labs  Lab 08/13/22 0036 08/14/22 1215 08/15/22 0910 08/16/22 0844 08/17/22 0346 08/18/22 0043  NA 135  138 134* 134* 133* 133*  K 4.2 3.9 3.5 3.7 4.1 4.1  CL 105 104 101 97* 99 101  CO2 23 25 26 27 28 25   GLUCOSE 122* 92 118* 107* 105* 119*  BUN 13 13 17 20 19 19   CREATININE 1.32* 1.17 1.16 1.25* 1.19 1.21  CALCIUM 7.6* 7.8* 7.5* 7.5* 7.6* 7.7*  MG 1.8 1.8 1.9 2.1 2.2  --    Liver Function Tests: No results for input(s): "AST", "ALT", "ALKPHOS", "BILITOT", "PROT", "ALBUMIN" in the last 168 hours. CBG: Recent Labs  Lab 08/17/22 0738  GLUCAP 104*    Discharge time spent: greater than 30 minutes.  Signed: Tyrone Nine, MD [facility] 08/18/2022

## 2022-08-18 NOTE — Progress Notes (Signed)
Report called to Dimas Aguas, LPN at [facility].

## 2022-08-18 NOTE — TOC Transition Note (Signed)
Transition of Care [facility]) - CM/SW Discharge Note   Patient Details  Name: Gregory Gibbs MRN: [id number] Date of Birth: [birth year]  [facility]) CM/SW Contact:  Carley Hammed, LCSW Phone Number: 08/18/2022, 11:25 AM   Clinical Narrative:    Pt to be transported to [facility] via [facility]. Nurse to call report to [phone].   Final next level of care: Skilled Nursing Facility Barriers to Discharge: Barriers Resolved   Patient Goals and CMS Choice CMS Medicare.gov Compare Post Acute Care list provided to:: Patient Choice offered to / list presented to : Patient  Discharge Placement                Patient chooses bed at: [facility], [place] Patient to be transferred to facility by: [facility] Name of family member notified: Pt/ multiple family members at bedside Patient and family notified of of transfer: 08/18/22  Discharge Plan and Services Additional resources added to the After Visit Summary for       Post Acute Care Choice: Skilled Nursing Facility                               Social Determinants of Health (SDOH) Interventions SDOH Screenings   Tobacco Use: Medium Risk (08/12/2022)     Readmission Risk Interventions     No data to display

## 2022-08-22 ENCOUNTER — Ambulatory Visit: Payer: Medicare HMO | Admitting: Cardiology

## 2022-08-22 DIAGNOSIS — Z79899 Other long term (current) drug therapy: Secondary | ICD-10-CM | POA: Diagnosis not present

## 2022-08-22 DIAGNOSIS — G8918 Other acute postprocedural pain: Secondary | ICD-10-CM | POA: Diagnosis not present

## 2022-08-22 DIAGNOSIS — D62 Acute posthemorrhagic anemia: Secondary | ICD-10-CM | POA: Diagnosis not present

## 2022-08-22 DIAGNOSIS — R262 Difficulty in walking, not elsewhere classified: Secondary | ICD-10-CM | POA: Diagnosis not present

## 2022-08-22 DIAGNOSIS — S72001D Fracture of unspecified part of neck of right femur, subsequent encounter for closed fracture with routine healing: Secondary | ICD-10-CM | POA: Diagnosis not present

## 2022-09-01 NOTE — Progress Notes (Signed)
Cardiology Office Note:    Date:  09/02/2022   ID:  Success, Dalley [birth year], MRN [id number]  PCP:  Hadley Pen, MD   [facility] Providers Cardiologist:  Gypsy Balsam, MD     Referring MD: Keturah Barre, MD   CC: follow up cad  History of Present Illness:    Gregory Gibbs is a 77 y.o. male with a hx of CAD s/p DES to proximal LAD on 06/03/2022, hypothyroidism, GERD.  He presented to [facility] health for evaluation of epigastric pain which been occurring for the past 8 days and 1 improving with rest.  Troponin I at 0.01, 0.02 and 0.02.  He underwent a treadmill Myoview which showed ST depression along the inferior and lateral leads, moderate size region of reversible ischemia in the anterior septal wall.  He was transferred to [facility] and underwent left heart cath on 06/03/22 which revealed two-vessel CAD with culprit lesion in the proximal large LAD, DES x 1; 75% segmental stenosis in the first OM with recommendations to treat medically.  Could consider staged PCI of the OM at a later time.  Continue DAPT x 1 year. LDL was elevated at 140.  Echo completed on 06/04/2022 revealed an EF of 45 to 50%, global hypokinesis, grade 1 DD, mild MR.  Evaluated on 06/16/22 post PCI doing stable. 08/10/22 admitted to [facility] s/p fall from roof with right femur fracture, eventually d/c'd to SNF.   He presents today accompanied by his daughter with several concerns. He has been having night sweats, increased frequency of urination and weight loss. He questions if this could be related to his Brilinta.  He is not having any more shortness of breath, but again is very eager to get off his Brilinta, as he was hoping for simplified medication approach.  He has not had further episodes of chest pain, palpitations, shortness of breath, dizziness, syncope, pedal edema.  Past Medical History:  Diagnosis Date   Acquired hypothyroidism 03/25/2014   Acute sinusitis 03/25/2014   Chronic foot  pain 03/25/2014   Chronic neck pain 11/04/2014   Cough 03/25/2014   Elevated PSA 02/04/2020   Gastro-esophageal reflux disease without esophagitis 03/25/2014   GERD (gastroesophageal reflux disease)    History of Helicobacter pylori infection 03/25/2014   Hypothyroidism    Infection due to 2019 novel coronavirus 08/06/2018   Formatting of this note might be different from the original. 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   Rhinosinusitis 11/10/2016   Seasonal allergies 12/07/2018   Seborrheic keratoses 12/07/2018   Syncope and collapse 06/04/2022   Unstable angina (HCC) 06/03/2022   Vitamin D deficiency 03/25/2014    Past Surgical History:  Procedure Laterality Date   CORONARY STENT INTERVENTION N/A 06/03/2022   Procedure: CORONARY STENT INTERVENTION;  Surgeon: Swaziland, Peter M, MD;  Location: [facility] INVASIVE CV LAB;  Service: Cardiovascular;  Laterality: N/A;   INTRAMEDULLARY (IM) NAIL INTERTROCHANTERIC Right 08/10/2022   Procedure: RIGHT PLACEMENT OF RIGHT LOWER EXTREMITY  TRACTION PIN;  Surgeon: Durene Romans, MD;  Location: [facility] OR;  Service: Orthopedics;  Laterality: Right;   INTRAMEDULLARY (IM) NAIL INTERTROCHANTERIC Right 08/12/2022   Procedure: INTRAMEDULLARY (IM) NAIL INTERTROCHANTERIC;  Surgeon: Roby Lofts, MD;  Location: [facility] OR;  Service: Orthopedics;  Laterality: Right;   LEFT HEART CATH  AND CORONARY ANGIOGRAPHY N/A 06/03/2022   Procedure: LEFT HEART CATH AND CORONARY ANGIOGRAPHY;  Surgeon: Swaziland, Peter M, MD;  Location: [facility] INVASIVE CV LAB;  Service: Cardiovascular;  Laterality: N/A;   left inguinal hernia repair Left    2024    Current Medications: Current Meds  Medication Sig   acetaminophen (TYLENOL) 325 MG tablet Take 2 tablets (650 mg total) by mouth every 6 (six) hours as needed for mild pain, headache or fever.   aspirin EC 81 MG tablet Take 1 tablet (81 mg total) by mouth daily. Swallow whole.   atorvastatin (LIPITOR) 80 MG tablet Take 1 tablet (80 mg total) by mouth daily.   Cyanocobalamin 1000 MCG TBCR Take 1 tablet by mouth daily.   ferrous sulfate 325 (65 FE) MG EC tablet Take 1 tablet (325 mg total) by mouth daily with breakfast.   levothyroxine (SYNTHROID) 25 MCG tablet Take 2.5 tablets (62.5 mcg total) by mouth every morning.   Multiple Vitamin (MULTIVITAMIN WITH MINERALS) TABS tablet Take 1 tablet by mouth daily.   nitroGLYCERIN (NITROSTAT) 0.4 MG SL tablet Place 1 tablet (0.4 mg total) under the tongue every 5 (five) minutes x 3 doses as needed for chest pain.   pantoprazole (PROTONIX) 40 MG tablet Take 40 mg by mouth daily.   polyethylene glycol (MIRALAX / GLYCOLAX) 17 g packet Take 17 g by mouth 2 (two) times daily.   ticagrelor (BRILINTA) 90 MG TABS tablet Take 1 tablet (90 mg total) by mouth 2 (two) times daily.   [DISCONTINUED] docusate sodium (COLACE) 100 MG capsule Take 1 capsule (100 mg total) by mouth 2 (two) times daily.   [DISCONTINUED] methocarbamol (ROBAXIN) 750 MG tablet Take 1 tablet (750 mg total) by mouth every 6 (six) hours as needed for muscle spasms.   [DISCONTINUED] ondansetron (ZOFRAN) 4 MG tablet Take 1 tablet (4 mg total) by mouth every 6 (six) hours as needed for nausea.   [DISCONTINUED] oxyCODONE 10 MG TABS Take 0.5-1 tablets (5-10 mg total) by mouth every 4 (four) hours as needed for moderate pain or severe pain (5 mg moderate pain,  10 mg severe pain).   [DISCONTINUED] senna (SENOKOT) 8.6 MG TABS tablet Take 2 tablets (17.2 mg total) by mouth in the morning and at bedtime.   [DISCONTINUED] Vitamin D, Ergocalciferol, (DRISDOL) 1.25 MG (50000 UNIT) CAPS capsule Take 1 capsule (50,000 Units total) by mouth every 7 (seven) days.     Allergies:   Patient has no known allergies.   Social History   Socioeconomic History   Marital status: Married    Spouse name: Not on file   Number of children: Not on file   Years of education: Not on file   Highest education level: Not on file  Occupational History   Not on file  Tobacco Use   Smoking status: Former    Types: Cigarettes   Smokeless tobacco: Never   Tobacco comments:    Quit in 1977, smoked a total of 12 years  Substance and Sexual Activity   Alcohol use: Never   Drug use: Never   Sexual activity: Not on file  Other Topics Concern   Not on file  Social  History Narrative   Not on file   Social Determinants of Health   Financial Resource Strain: Not on file  Food Insecurity: Not on file  Transportation Needs: Not on file  Physical Activity: Not on file  Stress: Not on file  Social Connections: Not on file     Family History: The patient's family history includes Atrial fibrillation in his sister; Dementia in his father; Heart failure in his mother.  ROS:   Please see the history of present illness.     All other systems reviewed and are negative.  EKGs/Labs/Other Studies Reviewed:    The following studies were reviewed today: Cardiac Studies & Procedures   CARDIAC CATHETERIZATION  CARDIAC CATHETERIZATION 06/03/2022  Narrative   Prox LAD lesion is 90% stenosed.   1st Diag lesion is 45% stenosed.   1st Mrg lesion is 75% stenosed.   A drug-eluting stent was successfully placed using a SYNERGY XD 3.0X12.   Post intervention, there is a 0% residual stenosis.   The left ventricular systolic function is normal.   LV end diastolic pressure is mildly  elevated.   The left ventricular ejection fraction is 55-65% by visual estimate.  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 75% segmental stenosis in the first OM Normal LV function Mildly elevated LVEDP 16 mm Hg Successful PCI of the proximal LAD with DES x 1.  Plan: DAPT for one year. Would treat OM disease medically. If he has refractory angina could consider staged PCI of the OM.  Findings Coronary Findings Diagnostic  Dominance: Left  Left Anterior Descending Prox LAD lesion is 90% stenosed.  First Diagonal Branch 1st Diag lesion is 45% stenosed.  Left Circumflex  First Obtuse Marginal Branch 1st Mrg lesion is 75% stenosed. The lesion is segmental.  Right Coronary Artery Vessel is small. Vessel is angiographically normal.  Intervention  Prox LAD lesion Stent CATH VISTA GUIDE 6FR XBLAD3.5 guide catheter was inserted. Lesion crossed with guidewire using a WIRE ASAHI PROWATER 180CM. Pre-stent angioplasty was performed using a BALLN EMERGE MR 2.5X12. A drug-eluting stent was successfully placed using a SYNERGY XD 3.0X12. Stent strut is well apposed. Post-stent angioplasty was performed using a BALLN [place] EMERGE MR 3.0X8. Maximum pressure:  18 atm. Post-Intervention Lesion Assessment The intervention was successful. Pre-interventional TIMI flow is 3. Post-intervention TIMI flow is 3. No complications occurred at this lesion. There is a 0% residual stenosis post intervention.   STRESS TESTS  MYOCARDIAL PERFUSION IMAGING 06/03/2022   ECHOCARDIOGRAM  ECHOCARDIOGRAM COMPLETE 06/04/2022  Narrative ECHOCARDIOGRAM REPORT    Patient Name:   Gregory Gibbs Date of Exam: 06/04/2022 Medical Rec #:  [id number]       Height:       68.0 in Accession #:    [id number]      Weight:       156.0 lb Date of Birth:  [birth year]       BSA:          1.839 m Patient Age:    77 years        BP:           115/75 mmHg Patient Gender: M                HR:           79 bpm. Exam Location:  Inpatient  Procedure: 2D Echo, Color Doppler and Cardiac Doppler  Indications:    R07.9* Chest pain, unspecified  History:        Patient has no prior history of Echocardiogram examinations.  Sonographer:    Irving Burton Senior RDCS Referring Phys: [phone] HAO MENG  IMPRESSIONS   1. Left ventricular ejection fraction, by estimation, is 45 to 50%. The left ventricle has mildly decreased function. The left ventricle demonstrates global hypokinesis. Left ventricular diastolic parameters are consistent with Grade I diastolic dysfunction (impaired relaxation). 2. Right ventricular systolic function is normal. The right ventricular size is normal. Tricuspid regurgitation signal is inadequate for assessing PA pressure. 3. The mitral valve is normal in structure. Mild mitral valve regurgitation. 4. The aortic valve is tricuspid. Aortic valve regurgitation is not visualized. 5. The inferior vena cava is normal in size with greater than 50% respiratory variability, suggesting right atrial pressure of 3 mmHg.  FINDINGS Left Ventricle: Left ventricular ejection fraction, by estimation, is 45 to 50%. The left ventricle has mildly decreased function. The left ventricle demonstrates global hypokinesis. The left ventricular internal cavity size was normal in size. There is no left ventricular hypertrophy. Left ventricular diastolic parameters are consistent with Grade I diastolic dysfunction (impaired relaxation).  Right Ventricle: The right ventricular size is normal. No increase in right ventricular wall thickness. Right ventricular systolic function is normal. Tricuspid regurgitation signal is inadequate for assessing PA pressure.  Left Atrium: Left atrial size was normal in size.  Right Atrium: Right atrial size was normal in size.  Pericardium: Trivial pericardial effusion is present.  Mitral Valve: The mitral valve is normal in structure. Mild mitral annular  calcification. Mild mitral valve regurgitation.  Tricuspid Valve: The tricuspid valve is normal in structure. Tricuspid valve regurgitation is trivial.  Aortic Valve: The aortic valve is tricuspid. Aortic valve regurgitation is not visualized.  Pulmonic Valve: The pulmonic valve was grossly normal. Pulmonic valve regurgitation is not visualized.  Aorta: The aortic root and ascending aorta are structurally normal, with no evidence of dilitation.  Venous: The inferior vena cava is normal in size with greater than 50% respiratory variability, suggesting right atrial pressure of 3 mmHg.  IAS/Shunts: The interatrial septum was not well visualized.   LEFT VENTRICLE PLAX 2D LVIDd:         5.00 cm      Diastology LVIDs:         3.70 cm      LV e' medial:    8.92 cm/s LV PW:         0.70 cm      LV E/e' medial:  9.1 LV IVS:        0.70 cm      LV e' lateral:   8.27 cm/s LVOT diam:     2.00 cm      LV E/e' lateral: 9.8 LV SV:         60 LV SV Index:   33 LVOT Area:     3.14 cm  LV Volumes (MOD) LV vol d, MOD A2C: 95.2 ml LV vol d, MOD A4C: 122.0 ml LV vol s, MOD A2C: 50.0 ml LV vol s, MOD A4C: 60.4 ml LV SV MOD A2C:     45.2 ml LV SV MOD A4C:     122.0 ml LV SV MOD BP:      52.0 ml  RIGHT VENTRICLE RV S prime:     11.50 cm/s TAPSE (M-mode): 2.0 cm  LEFT ATRIUM             Index        RIGHT  ATRIUM           Index LA diam:        3.50 cm 1.90 cm/m   RA Area:     14.60 cm LA Vol (A2C):   49.3 ml 26.81 ml/m  RA Volume:   33.50 ml  18.22 ml/m LA Vol (A4C):   38.6 ml 20.99 ml/m LA Biplane Vol: 44.7 ml 24.31 ml/m AORTIC VALVE LVOT Vmax:   87.80 cm/s LVOT Vmean:  65.500 cm/s LVOT VTI:    0.191 m  AORTA Ao Root diam: 2.90 cm Ao Asc diam:  3.20 cm  MITRAL VALVE MV Area (PHT): 3.48 cm    SHUNTS MV Decel Time: 218 msec    Systemic VTI:  0.19 m MV E velocity: 81.20 cm/s  Systemic Diam: 2.00 cm MV A velocity: 87.00 cm/s MV E/A ratio:  0.93  Donato Schultz MD Electronically  signed by Donato Schultz MD Signature Date/Time: 06/04/2022/1:10:25 PM    Final              EKG:  EKG is not ordered today.    Recent Labs: 08/10/2022: ALT 25 08/17/2022: Magnesium 2.2; Platelets 261 08/18/2022: BUN 19; Creatinine, Ser 1.21; Hemoglobin 8.2; Potassium 4.1; Sodium 133; TSH 8.861  Recent Lipid Panel    Component Value Date/Time   CHOL 101 07/18/2022 0851   TRIG 72 07/18/2022 0851   HDL 43 07/18/2022 0851   CHOLHDL 2.3 07/18/2022 0851   CHOLHDL 4.4 06/04/2022 0159   VLDL 17 06/04/2022 0159   LDLCALC 43 07/18/2022 0851     Risk Assessment/Calculations:                Physical Exam:    VS:  BP (!) 100/52 (BP Location: Left Arm, Patient Position: Sitting)   Pulse 92   Ht 5\' 9"  (1.753 m)   Wt 151 lb 12.8 oz (68.9 kg)   SpO2 95%   BMI 22.42 kg/m     Wt Readings from Last 3 Encounters:  09/02/22 151 lb 12.8 oz (68.9 kg)  08/18/22 173 lb 1 oz (78.5 kg)  06/29/22 152 lb (68.9 kg)     GEN:  Well nourished, well developed in no acute distress HEENT: Normal NECK: No JVD; No carotid bruits LYMPHATICS: No lymphadenopathy CARDIAC: RRR, no murmurs, rubs, gallops RESPIRATORY:  Clear to auscultation without rales, wheezing or rhonchi  ABDOMEN: Soft, non-tender, non-distended MUSCULOSKELETAL:  No edema; No deformity  SKIN: Warm and dry NEUROLOGIC:  Alert and oriented x 3 PSYCHIATRIC:  Normal affect   ASSESSMENT:    1. Coronary artery disease involving native coronary artery of native heart with angina pectoris (HCC)   2. Orthostatic hypotension   3. Dysuria     PLAN:    In order of problems listed above:  CAD-s/p DES to proximal LAD on 06/03/2022; recommendations to continue DAPT x 1 year with aspirin 81 mg daily and Brilinta 90 mg twice daily.  Has nitroglycerin however has not needed.  Stable with no anginal symptoms. No indication for ischemic evaluation.  Continue Lipitor 80 mg daily, has not been on a beta-blocker secondary to orthostatic  hypotension Hyperlipidemia-LDL is well-controlled at 43, continue Lipitor 80 mg daily. HFmrEF/ischemic cardiomyopathy- Echo completed on 06/04/2022 revealed an EF of 45 to 50%, global hypokinesis, grade 1 DD, mild MR. NYHA class I, asymptomatic, euvolemic.  He is not interested in taking any additional medications at this time.  Will repeat echocardiogram in 6 months.  Was unable to tolerate beta-blocker secondary  to orthostatic hypotension. CKD stage III-most recent creatinine 1.21, Careful titration of diuretic and antihypertensive.   Dysuria - increased frequency 4-5 times at night along with night sweats. Will check urine and labs today. He will see his PCP on Tuesday and we will forward results to Dr. Sherral Hammers.   Disposition - Check urine, CBC, BMET. Return in 3 months to general cardiology.       Medication Adjustments/Labs and Tests Ordered: Current medicines are reviewed at length with the patient today.  Concerns regarding medicines are outlined above.  Orders Placed This Encounter  Procedures   Urine Culture   CBC   Basic metabolic panel   Urinalysis   EKG 12-Lead   No orders of the defined types were placed in this encounter.   Patient Instructions  Medication Instructions:  Your physician recommends that you continue on your current medications as directed. Please refer to the Current Medication list given to you today.  *If you need a refill on your cardiac medications before your next appointment, please call your pharmacy*   Lab Work: CBC, BMP, U/A with culture- today If you have labs (blood work) drawn today and your tests are completely normal, you will receive your results only by: MyChart Message (if you have MyChart) OR A paper copy in the mail If you have any lab test that is abnormal or we need to change your treatment, we will call you to review the results.   Testing/Procedures: None Ordered   Follow-Up: At [facility], you and your health needs are  our priority.  As part of our continuing mission to provide you with exceptional heart care, we have created designated Provider Care Teams.  These Care Teams include your primary Cardiologist (physician) and Advanced Practice Providers (APPs -  Physician Assistants and Nurse Practitioners) who all work together to provide you with the care you need, when you need it.  We recommend signing up for the patient portal called "MyChart".  Sign up information is provided on this After Visit Summary.  MyChart is used to connect with patients for Virtual Visits (Telemedicine).  Patients are able to view lab/test results, encounter notes, upcoming appointments, etc.  Non-urgent messages can be sent to your provider as well.   To learn more about what you can do with MyChart, go to [email or web address].    Your next appointment:   3 month(s) with Dr. Bing Matter  The format for your next appointment:   In Person  Provider:   Wallis Bamberg, NP   Other Instructions NA    Signed, Flossie Dibble, NP  09/02/2022 12:42 PM     [facility]

## 2022-09-02 ENCOUNTER — Encounter: Payer: Self-pay | Admitting: Cardiology

## 2022-09-02 ENCOUNTER — Ambulatory Visit: Payer: Medicare HMO | Attending: Cardiology | Admitting: Cardiology

## 2022-09-02 VITALS — BP 100/52 | HR 92 | Ht 69.0 in | Wt 151.8 lb

## 2022-09-02 DIAGNOSIS — I255 Ischemic cardiomyopathy: Secondary | ICD-10-CM | POA: Diagnosis not present

## 2022-09-02 DIAGNOSIS — I5020 Unspecified systolic (congestive) heart failure: Secondary | ICD-10-CM

## 2022-09-02 DIAGNOSIS — N1831 Chronic kidney disease, stage 3a: Secondary | ICD-10-CM

## 2022-09-02 DIAGNOSIS — I951 Orthostatic hypotension: Secondary | ICD-10-CM

## 2022-09-02 DIAGNOSIS — I5022 Chronic systolic (congestive) heart failure: Secondary | ICD-10-CM | POA: Diagnosis not present

## 2022-09-02 DIAGNOSIS — R300 Dysuria: Secondary | ICD-10-CM

## 2022-09-02 DIAGNOSIS — I25119 Atherosclerotic heart disease of native coronary artery with unspecified angina pectoris: Secondary | ICD-10-CM | POA: Diagnosis not present

## 2022-09-02 DIAGNOSIS — E782 Mixed hyperlipidemia: Secondary | ICD-10-CM

## 2022-09-02 NOTE — Patient Instructions (Addendum)
Medication Instructions:  Your physician recommends that you continue on your current medications as directed. Please refer to the Current Medication list given to you today.  *If you need a refill on your cardiac medications before your next appointment, please call your pharmacy*   Lab Work: CBC, BMP, U/A with culture- today If you have labs (blood work) drawn today and your tests are completely normal, you will receive your results only by: MyChart Message (if you have MyChart) OR A paper copy in the mail If you have any lab test that is abnormal or we need to change your treatment, we will call you to review the results.   Testing/Procedures: None Ordered   Follow-Up: At [facility], you and your health needs are our priority.  As part of our continuing mission to provide you with exceptional heart care, we have created designated Provider Care Teams.  These Care Teams include your primary Cardiologist (physician) and Advanced Practice Providers (APPs -  Physician Assistants and Nurse Practitioners) who all work together to provide you with the care you need, when you need it.  We recommend signing up for the patient portal called "MyChart".  Sign up information is provided on this After Visit Summary.  MyChart is used to connect with patients for Virtual Visits (Telemedicine).  Patients are able to view lab/test results, encounter notes, upcoming appointments, etc.  Non-urgent messages can be sent to your provider as well.   To learn more about what you can do with MyChart, go to [email or web address].    Your next appointment:   3 month(s) with Dr. Bing Matter  The format for your next appointment:   In Person  Provider:   Wallis Bamberg, NP   Other Instructions NA

## 2022-09-03 LAB — URINALYSIS
Glucose, UA: NEGATIVE
Leukocytes,UA: NEGATIVE
Nitrite, UA: POSITIVE — AB
RBC, UA: NEGATIVE
Specific Gravity, UA: >=1.03 — AB (ref 1.005–1.030)
Urobilinogen, Ur: 0.2 mg/dL (ref 0.2–1.0)
pH, UA: 5 (ref 5.0–7.5)

## 2022-09-03 LAB — CBC
Hematocrit: 33.2 % — ABNORMAL LOW (ref 37.5–51.0)
Hemoglobin: 10.6 g/dL — ABNORMAL LOW (ref 13.0–17.7)
MCH: 29.1 pg (ref 26.6–33.0)
MCHC: 31.9 g/dL (ref 31.5–35.7)
MCV: 91 fL (ref 79–97)
Platelets: 624 10*3/uL — ABNORMAL HIGH (ref 150–450)
RBC: 3.64 x10E6/uL — ABNORMAL LOW (ref 4.14–5.80)
RDW: 13.7 % (ref 11.6–15.4)
WBC: 9.1 10*3/uL (ref 3.4–10.8)

## 2022-09-03 LAB — BASIC METABOLIC PANEL
BUN/Creatinine Ratio: 18 (ref 10–24)
BUN: 20 mg/dL (ref 8–27)
CO2: 22 mmol/L (ref 20–29)
Calcium: 9.4 mg/dL (ref 8.6–10.2)
Chloride: 102 mmol/L (ref 96–106)
Creatinine, Ser: 1.09 mg/dL (ref 0.76–1.27)
Glucose: 91 mg/dL (ref 70–99)
Potassium: 4.6 mmol/L (ref 3.5–5.2)
Sodium: 140 mmol/L (ref 134–144)
eGFR: 71 mL/min/{1.73_m2} (ref >59)

## 2022-09-04 LAB — URINE CULTURE: Organism ID, Bacteria: NO GROWTH

## 2022-09-05 ENCOUNTER — Telehealth: Payer: Self-pay | Admitting: Cardiology

## 2022-09-06 DIAGNOSIS — R350 Frequency of micturition: Secondary | ICD-10-CM | POA: Diagnosis not present

## 2022-09-06 DIAGNOSIS — R399 Unspecified symptoms and signs involving the genitourinary system: Secondary | ICD-10-CM | POA: Diagnosis not present

## 2022-09-06 DIAGNOSIS — Z0000 Encounter for general adult medical examination without abnormal findings: Secondary | ICD-10-CM | POA: Diagnosis not present

## 2022-09-06 DIAGNOSIS — E039 Hypothyroidism, unspecified: Secondary | ICD-10-CM | POA: Diagnosis not present

## 2022-09-06 NOTE — Telephone Encounter (Signed)
Spoke with pt regarding Gregory Gibbs note. Pt will be seeing PCP at 12 noon. Results have been forwarded to PCP. Pt verbalized understanding and had no further questions.

## 2022-09-12 DIAGNOSIS — S72141D Displaced intertrochanteric fracture of right femur, subsequent encounter for closed fracture with routine healing: Secondary | ICD-10-CM | POA: Diagnosis not present

## 2022-09-12 DIAGNOSIS — S7221XD Displaced subtrochanteric fracture of right femur, subsequent encounter for closed fracture with routine healing: Secondary | ICD-10-CM | POA: Diagnosis not present

## 2022-09-21 DIAGNOSIS — S7291XA Unspecified fracture of right femur, initial encounter for closed fracture: Secondary | ICD-10-CM | POA: Diagnosis not present

## 2022-09-21 DIAGNOSIS — M6281 Muscle weakness (generalized): Secondary | ICD-10-CM | POA: Diagnosis not present

## 2022-09-21 DIAGNOSIS — X58XXXA Exposure to other specified factors, initial encounter: Secondary | ICD-10-CM | POA: Diagnosis not present

## 2022-09-21 DIAGNOSIS — R2689 Other abnormalities of gait and mobility: Secondary | ICD-10-CM | POA: Diagnosis not present

## 2022-09-23 DIAGNOSIS — M6281 Muscle weakness (generalized): Secondary | ICD-10-CM | POA: Diagnosis not present

## 2022-09-23 DIAGNOSIS — X58XXXA Exposure to other specified factors, initial encounter: Secondary | ICD-10-CM | POA: Diagnosis not present

## 2022-09-23 DIAGNOSIS — R2689 Other abnormalities of gait and mobility: Secondary | ICD-10-CM | POA: Diagnosis not present

## 2022-09-23 DIAGNOSIS — S7291XA Unspecified fracture of right femur, initial encounter for closed fracture: Secondary | ICD-10-CM | POA: Diagnosis not present

## 2022-09-26 DIAGNOSIS — S72141D Displaced intertrochanteric fracture of right femur, subsequent encounter for closed fracture with routine healing: Secondary | ICD-10-CM | POA: Diagnosis not present

## 2022-09-26 DIAGNOSIS — S7221XD Displaced subtrochanteric fracture of right femur, subsequent encounter for closed fracture with routine healing: Secondary | ICD-10-CM | POA: Diagnosis not present

## 2022-09-26 DIAGNOSIS — M79605 Pain in left leg: Secondary | ICD-10-CM | POA: Diagnosis not present

## 2022-09-30 DIAGNOSIS — M19071 Primary osteoarthritis, right ankle and foot: Secondary | ICD-10-CM | POA: Diagnosis not present

## 2022-09-30 DIAGNOSIS — M25561 Pain in right knee: Secondary | ICD-10-CM | POA: Diagnosis not present

## 2022-09-30 DIAGNOSIS — M1711 Unilateral primary osteoarthritis, right knee: Secondary | ICD-10-CM | POA: Diagnosis not present

## 2022-09-30 DIAGNOSIS — M79604 Pain in right leg: Secondary | ICD-10-CM | POA: Diagnosis not present

## 2022-09-30 DIAGNOSIS — R600 Localized edema: Secondary | ICD-10-CM | POA: Diagnosis not present

## 2022-10-24 DIAGNOSIS — S7221XD Displaced subtrochanteric fracture of right femur, subsequent encounter for closed fracture with routine healing: Secondary | ICD-10-CM | POA: Diagnosis not present

## 2022-11-01 DIAGNOSIS — R1032 Left lower quadrant pain: Secondary | ICD-10-CM | POA: Diagnosis not present

## 2022-11-15 DIAGNOSIS — M79605 Pain in left leg: Secondary | ICD-10-CM | POA: Diagnosis not present

## 2022-11-15 DIAGNOSIS — S7221XD Displaced subtrochanteric fracture of right femur, subsequent encounter for closed fracture with routine healing: Secondary | ICD-10-CM | POA: Diagnosis not present

## 2022-12-05 ENCOUNTER — Ambulatory Visit: Payer: Medicare HMO | Attending: Cardiology | Admitting: Cardiology

## 2022-12-05 VITALS — BP 120/72 | HR 90 | Ht 69.0 in | Wt 159.2 lb

## 2022-12-05 DIAGNOSIS — I255 Ischemic cardiomyopathy: Secondary | ICD-10-CM

## 2022-12-05 DIAGNOSIS — I2510 Atherosclerotic heart disease of native coronary artery without angina pectoris: Secondary | ICD-10-CM | POA: Diagnosis not present

## 2022-12-05 DIAGNOSIS — R0609 Other forms of dyspnea: Secondary | ICD-10-CM

## 2022-12-05 DIAGNOSIS — I5022 Chronic systolic (congestive) heart failure: Secondary | ICD-10-CM | POA: Diagnosis not present

## 2022-12-05 NOTE — Patient Instructions (Signed)

## 2022-12-05 NOTE — Progress Notes (Unsigned)
Cardiology Office Note:    Date:  12/05/2022   ID:  Bogart, Winzeler [birth year], MRN [id number]  PCP:  Hadley Pen, MD  Cardiologist:  Gypsy Balsam, MD    Referring MD: Hadley Pen, MD   Chief Complaint  Patient presents with   Medication Management    History of Present Illness:    Gregory Gibbs is a 77 y.o. male comes today to months for follow-up.  Past medical history significant for coronary artery disease in May 31 he did have cardiac catheterization which showed two-vessel's disease proximal LAD has been addressed with drug-eluting stent, first obtuse marginal branch were elected to treat medically.  His ejection fraction is mildly reduced 45 to 50%.  Comes today to months for follow-up shortly after his cardiac catheterization he had a falling of the roof and breaking his sleep required surgical intervention.  Otherwise doing well since that time.  Past Medical History:  Diagnosis Date   Acquired hypothyroidism 03/25/2014   Acute sinusitis 03/25/2014   Chronic foot pain 03/25/2014   Chronic neck pain 11/04/2014   Cough 03/25/2014   Elevated PSA 02/04/2020   Gastro-esophageal reflux disease without esophagitis 03/25/2014   GERD (gastroesophageal reflux disease)    History of Helicobacter pylori infection 03/25/2014   Hypothyroidism    Infection due to 2019 novel coronavirus 08/06/2018   Formatting of this note might be different from the original. 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   Rhinosinusitis 11/10/2016   Seasonal allergies 12/07/2018   Seborrheic keratoses 12/07/2018   Syncope and collapse 06/04/2022   Unstable angina (HCC) 06/03/2022   Vitamin D deficiency 03/25/2014    Past Surgical History:  Procedure Laterality Date   CORONARY STENT INTERVENTION N/A 06/03/2022   Procedure: CORONARY STENT INTERVENTION;  Surgeon: Swaziland, Peter M, MD;  Location: [facility] INVASIVE CV LAB;  Service: Cardiovascular;  Laterality: N/A;   INTRAMEDULLARY (IM) NAIL INTERTROCHANTERIC Right 08/10/2022   Procedure: RIGHT PLACEMENT OF RIGHT LOWER EXTREMITY TRACTION PIN;  Surgeon: Durene Romans, MD;  Location: [facility] OR;  Service: Orthopedics;  Laterality: Right;   INTRAMEDULLARY (IM) NAIL INTERTROCHANTERIC Right 08/12/2022   Procedure: INTRAMEDULLARY (IM) NAIL INTERTROCHANTERIC;  Surgeon: Roby Lofts, MD;  Location: [facility] OR;  Service: Orthopedics;  Laterality: Right;   LEFT HEART CATH AND CORONARY ANGIOGRAPHY N/A 06/03/2022   Procedure: LEFT HEART CATH AND CORONARY ANGIOGRAPHY;  Surgeon: Swaziland, Peter M, MD;  Location: [facility] INVASIVE CV LAB;  Service: Cardiovascular;  Laterality: N/A;   left inguinal hernia repair Left    2024    Current Medications: Current Meds  Medication Sig   acetaminophen (TYLENOL) 325 MG tablet Take 2 tablets (650 mg total) by mouth every 6 (six) hours as needed for mild pain, headache or fever.   aspirin EC 81 MG tablet Take 1 tablet (81 mg total) by mouth daily. Swallow whole.   atorvastatin (LIPITOR) 80 MG tablet Take 1 tablet (80 mg total) by mouth daily.   Cyanocobalamin 1000  MCG TBCR Take 1 tablet by mouth daily.   ferrous sulfate 325 (65 FE) MG EC tablet Take 1 tablet (325 mg total) by mouth daily with breakfast.   levothyroxine (SYNTHROID) 25 MCG tablet Take 2.5 tablets (62.5 mcg total) by mouth every morning. (Patient taking  differently: Take 65 mcg by mouth every morning.)   Multiple Vitamin (MULTIVITAMIN WITH MINERALS) TABS tablet Take 1 tablet by mouth daily.   nitroGLYCERIN (NITROSTAT) 0.4 MG SL tablet Place 1 tablet (0.4 mg total) under the tongue every 5 (five) minutes x 3 doses as needed for chest pain.   pantoprazole (PROTONIX) 40 MG tablet Take 40 mg by mouth daily as needed (Indigestion).   ticagrelor (BRILINTA) 90 MG TABS tablet Take 1 tablet (90 mg total) by mouth 2 (two) times daily.   [DISCONTINUED] polyethylene glycol (MIRALAX / GLYCOLAX) 17 g packet Take 17 g by mouth 2 (two) times daily.     Allergies:   Patient has no known allergies.   Social History   Socioeconomic History   Marital status: Married    Spouse name: Not on file   Number of children: Not on file   Years of education: Not on file   Highest education level: Not on file  Occupational History   Not on file  Tobacco Use   Smoking status: Former    Types: Cigarettes   Smokeless tobacco: Never   Tobacco comments:    Quit in 1977, smoked a total of 12 years  Substance and Sexual Activity   Alcohol use: Never   Drug use: Never   Sexual activity: Not on file  Other Topics Concern   Not on file  Social History Narrative   Not on file   Social Determinants of Health   Financial Resource Strain: Not on file  Food Insecurity: Not on file  Transportation Needs: Not on file  Physical Activity: Not on file  Stress: Not on file  Social Connections: Not on file     Family History: The patient's family history includes Atrial fibrillation in his sister; Dementia in his father; Heart failure in his mother. ROS:   Please see the history of present illness.    All 14 point review of systems negative except as described per history of present illness  EKGs/Labs/Other Studies Reviewed:         Recent Labs: 08/10/2022: ALT 25 08/17/2022: Magnesium 2.2 08/18/2022: TSH 8.861 09/02/2022: BUN 20; Creatinine, Ser 1.09; Hemoglobin  10.6; Platelets 624; Potassium 4.6; Sodium 140  Recent Lipid Panel    Component Value Date/Time   CHOL 101 07/18/2022 0851   TRIG 72 07/18/2022 0851   HDL 43 07/18/2022 0851   CHOLHDL 2.3 07/18/2022 0851   CHOLHDL 4.4 06/04/2022 0159   VLDL 17 06/04/2022 0159   LDLCALC 43 07/18/2022 0851    Physical Exam:    VS:  BP 120/72 (BP Location: Left Arm, Patient Position: Sitting)   Pulse 90   Ht 5\' 9"  (1.753 m)   Wt 159 lb 3.2 oz (72.2 kg)   SpO2 97%   BMI 23.51 kg/m     Wt Readings from Last 3 Encounters:  12/05/22 159 lb 3.2 oz (72.2 kg)  09/02/22 151 lb 12.8 oz (68.9 kg)  08/18/22 173 lb 1 oz (78.5 kg)     GEN:  Well nourished, well developed in no acute distress HEENT: Normal NECK: No JVD; No carotid bruits LYMPHATICS: No lymphadenopathy CARDIAC: RRR, no murmurs, no rubs, no gallops RESPIRATORY:  Clear  to auscultation without rales, wheezing or rhonchi  ABDOMEN: Soft, non-tender, non-distended MUSCULOSKELETAL:  No edema; No deformity  SKIN: Warm and dry LOWER EXTREMITIES: no swelling NEUROLOGIC:  Alert and oriented x 3 PSYCHIATRIC:  Normal affect   ASSESSMENT:    1. Coronary artery disease involving native coronary artery of native heart without angina pectoris   2. Chronic systolic heart failure (HCC)   3. Ischemic cardiomyopathy    PLAN:    In order of problems listed above:  Coronary disease stable from that point review, will continue dual antiplatelet therapy until May 2025. Chronic systolic congestive heart failure.  Reluctant to add any medication to his medication he is being already taking.  Will schedule him to have echocardiogram repeated and then will decide about potentially putting him on Entresto. Dyslipidemia I did review K PN which show me LDL 43 HDL 43 this is from summer of last year.  He is getting ready to go to see his primary care physician who will repeat his fasting lipid profile.   Medication Adjustments/Labs and Tests Ordered: Current  medicines are reviewed at length with the patient today.  Concerns regarding medicines are outlined above.  No orders of the defined types were placed in this encounter.  Medication changes: No orders of the defined types were placed in this encounter.   Signed, Georgeanna Lea, MD, [facility] 12/05/2022 11:50 AM    [facility]

## 2022-12-14 DIAGNOSIS — R944 Abnormal results of kidney function studies: Secondary | ICD-10-CM | POA: Diagnosis not present

## 2022-12-14 DIAGNOSIS — E782 Mixed hyperlipidemia: Secondary | ICD-10-CM | POA: Diagnosis not present

## 2022-12-14 DIAGNOSIS — E039 Hypothyroidism, unspecified: Secondary | ICD-10-CM | POA: Diagnosis not present

## 2022-12-16 DIAGNOSIS — E782 Mixed hyperlipidemia: Secondary | ICD-10-CM | POA: Diagnosis not present

## 2022-12-16 DIAGNOSIS — E039 Hypothyroidism, unspecified: Secondary | ICD-10-CM | POA: Diagnosis not present

## 2022-12-16 DIAGNOSIS — E538 Deficiency of other specified B group vitamins: Secondary | ICD-10-CM | POA: Diagnosis not present

## 2022-12-16 DIAGNOSIS — K219 Gastro-esophageal reflux disease without esophagitis: Secondary | ICD-10-CM | POA: Diagnosis not present

## 2022-12-21 DIAGNOSIS — H6123 Impacted cerumen, bilateral: Secondary | ICD-10-CM | POA: Diagnosis not present

## 2022-12-21 DIAGNOSIS — H905 Unspecified sensorineural hearing loss: Secondary | ICD-10-CM | POA: Diagnosis not present

## 2022-12-22 ENCOUNTER — Telehealth: Payer: Self-pay | Admitting: Cardiology

## 2022-12-22 MED ORDER — TICAGRELOR 90 MG PO TABS: 90.0000 mg | ORAL_TABLET | Freq: Two times a day (BID) | ORAL | 3 refills | Status: DC

## 2022-12-22 MED ORDER — ATORVASTATIN CALCIUM 80 MG PO TABS: 80.0000 mg | ORAL_TABLET | Freq: Every day | ORAL | 3 refills | Status: DC

## 2022-12-22 NOTE — Telephone Encounter (Signed)
*  STAT* If patient is at the pharmacy, call can be transferred to refill team.   1. Which medications need to be refilled? (please list name of each medication and dose if known)   ticagrelor (BRILINTA) 90 MG TABS tablet  atorvastatin (LIPITOR) 80 MG tablet   2. Would you like to learn more about the convenience, safety, & potential cost savings by using the [facility]?   3. Are you open to using the [facility] (Type [facility]. ).   4. Which pharmacy/location (including street and city if local pharmacy) is medication to be sent to?  [id number] - [place], [place] - [address] AT [address]   5. Do they need a 30 day or 90 day supply?   90 day  Patient stated he has about 3 days left of these medications.

## 2023-01-03 DIAGNOSIS — R051 Acute cough: Secondary | ICD-10-CM | POA: Diagnosis not present

## 2023-01-03 DIAGNOSIS — R0981 Nasal congestion: Secondary | ICD-10-CM | POA: Diagnosis not present

## 2023-01-10 ENCOUNTER — Ambulatory Visit: Payer: Medicare HMO | Attending: Cardiology

## 2023-01-10 DIAGNOSIS — R0609 Other forms of dyspnea: Secondary | ICD-10-CM

## 2023-01-10 LAB — ECHOCARDIOGRAM COMPLETE
Area-P 1/2: 3.7 cm2
Calc EF: 52.2 %
MV M vel: 2.98 m/s
MV Peak grad: 35.5 mm[Hg]
S' Lateral: 3.8 cm
Single Plane A2C EF: 51.5 %
Single Plane A4C EF: 54 %

## 2023-01-17 ENCOUNTER — Telehealth: Payer: Self-pay | Admitting: Cardiology

## 2023-01-17 ENCOUNTER — Telehealth: Payer: Self-pay

## 2023-01-17 NOTE — Telephone Encounter (Signed)
 Left message on My Chart with Echo results per Dr. Vanetta Shawl note. Routed to PCP.

## 2023-01-17 NOTE — Telephone Encounter (Signed)
 Sent pt message on My Chart and pt viewed results of Echo per Dr. Vanetta Shawl note.

## 2023-01-17 NOTE — Telephone Encounter (Signed)
 Patient calling in to get his results. Please advise

## 2023-03-23 ENCOUNTER — Telehealth: Payer: Self-pay

## 2023-03-23 MED ORDER — CLOPIDOGREL BISULFATE 75 MG PO TABS: 75.0000 mg | ORAL_TABLET | Freq: Every day | ORAL | 0 refills | Status: AC

## 2023-03-23 MED ORDER — CLOPIDOGREL BISULFATE 75 MG PO TABS: 75.0000 mg | ORAL_TABLET | Freq: Every day | ORAL | 0 refills | Status: DC

## 2023-03-23 NOTE — Telephone Encounter (Signed)
 Pt came by asking if he could stop Brilinta since he only has 2 more months left. He stated it had become so expensive. Dr. Bing Matter recommended. Plavix 75mg  1 tablet daily #60 sent to [facility] on [address].

## 2023-05-23 ENCOUNTER — Telehealth: Payer: Self-pay | Admitting: Cardiology

## 2023-05-23 NOTE — Telephone Encounter (Signed)
 Pt c/o medication issue:  1. Name of Medication:   clopidogrel  (PLAVIX ) 75 MG tablet    2. How are you currently taking this medication (dosage and times per day)? Take 1 tablet (75 mg total) by mouth daily.   3. Are you having a reaction (difficulty breathing--STAT)? No  4. What is your medication issue? Patient had a procedure last year in May and was informed he would need to take this medication until the end of May this year. Patient stated he only has enough to last until 05/25/23 and would like if he should stop the medication once he is out. Please advise.

## 2023-05-24 NOTE — Telephone Encounter (Signed)
 Spoke with pt per Dr. Krasowski pt may stop Plavix  when he runs out on May 25, 2023. Pt verbalized understanding and will follow up on June 4 for appt.

## 2023-06-07 ENCOUNTER — Ambulatory Visit: Attending: Cardiology | Admitting: Cardiology

## 2023-06-07 ENCOUNTER — Encounter: Payer: Self-pay | Admitting: Cardiology

## 2023-06-07 VITALS — BP 134/72 | HR 70 | Ht 69.0 in | Wt 159.6 lb

## 2023-06-07 DIAGNOSIS — I255 Ischemic cardiomyopathy: Secondary | ICD-10-CM

## 2023-06-07 DIAGNOSIS — I2510 Atherosclerotic heart disease of native coronary artery without angina pectoris: Secondary | ICD-10-CM

## 2023-06-07 DIAGNOSIS — E782 Mixed hyperlipidemia: Secondary | ICD-10-CM

## 2023-06-07 DIAGNOSIS — I5022 Chronic systolic (congestive) heart failure: Secondary | ICD-10-CM

## 2023-06-07 DIAGNOSIS — R079 Chest pain, unspecified: Secondary | ICD-10-CM

## 2023-06-07 MED ORDER — RANOLAZINE ER 500 MG PO TB12: 500.0000 mg | ORAL_TABLET | Freq: Two times a day (BID) | ORAL | 1 refills | Status: DC

## 2023-06-07 NOTE — Patient Instructions (Signed)
 Medication Instructions:   START: Ranolazine 500mg  1 tablet twice daily   Lab Work: None Ordered If you have labs (blood work) drawn today and your tests are completely normal, you will receive your results only by: MyChart Message (if you have MyChart) OR A paper copy in the mail If you have any lab test that is abnormal or we need to change your treatment, we will call you to review the results.   Testing/Procedures: None Ordered   Follow-Up: At [facility], you and your health needs are our priority.  As part of our continuing mission to provide you with exceptional heart care, we have created designated Provider Care Teams.  These Care Teams include your primary Cardiologist (physician) and Advanced Practice Providers (APPs -  Physician Assistants and Nurse Practitioners) who all work together to provide you with the care you need, when you need it.  We recommend signing up for the patient portal called "MyChart".  Sign up information is provided on this After Visit Summary.  MyChart is used to connect with patients for Virtual Visits (Telemedicine).  Patients are able to view lab/test results, encounter notes, upcoming appointments, etc.  Non-urgent messages can be sent to your provider as well.   To learn more about what you can do with MyChart, go to [email or web address].    Your next appointment:   1 month(s)  The format for your next appointment:   In Person  Provider:   Gypsy Balsam, MD    Other Instructions NA

## 2023-06-07 NOTE — Progress Notes (Unsigned)
 Cardiology Office Note:    Date:  06/07/2023   ID:  Gregory Gibbs, DOB February 13, 1945, MRN [id number]  PCP:  Melva Stabile, MD  Cardiologist:  Ralene Burger, MD    Referring MD: Melva Stabile, MD   Chief Complaint  Patient presents with   Chest Pain    History of Present Illness:    Gregory Gibbs is a 78 y.o. male past medical history significant for coronary disease in Jun 03, 2022 he had cardiac catheterization which showed two-vessel disease, proximal LAD has been addressed with drug-eluting stent, first obtuse marginal branch was elected to treat medically at that time he got diminished ejection fraction 4550% but after that echocardiogram showed normalization of left ventricular ejection fraction.  Comes today to months for follow-up he described to have a chest pain.  He points towards his epigastrium he said it something completely different compared to what he had before before he had fairly classical exertional angina pectoris now he got pain that happen at rest he at the same time he can walk climb stairs with no difficulties  Past Medical History:  Diagnosis Date   Acquired hypothyroidism 03/25/2014   Acute sinusitis 03/25/2014   Chronic foot pain 03/25/2014   Chronic neck pain 11/04/2014   Cough 03/25/2014   Elevated PSA 02/04/2020   Gastro-esophageal reflux disease without esophagitis 03/25/2014   GERD (gastroesophageal reflux disease)    History of Helicobacter pylori infection 03/25/2014   Hypothyroidism    Infection due to 2019 novel coronavirus 08/06/2018   Formatting of this note might be different from the original. 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   Rhinosinusitis 11/10/2016   Seasonal allergies 12/07/2018   Seborrheic keratoses 12/07/2018   Syncope and collapse 06/04/2022   Unstable angina (HCC) 06/03/2022   Vitamin D  deficiency 03/25/2014    Past Surgical History:  Procedure Laterality Date   CORONARY STENT INTERVENTION N/A 06/03/2022   Procedure: CORONARY STENT INTERVENTION;  Surgeon: Swaziland, Peter M, MD;  Location: [facility] INVASIVE CV LAB;  Service: Cardiovascular;  Laterality: N/A;   INTRAMEDULLARY (IM) NAIL INTERTROCHANTERIC Right 08/10/2022   Procedure: RIGHT PLACEMENT OF RIGHT LOWER EXTREMITY TRACTION PIN;  Surgeon: Claiborne Crew, MD;  Location: [facility] OR;  Service: Orthopedics;  Laterality: Right;   INTRAMEDULLARY (IM) NAIL INTERTROCHANTERIC Right 08/12/2022   Procedure: INTRAMEDULLARY (IM) NAIL INTERTROCHANTERIC;  Surgeon: Laneta Pintos, MD;  Location: [facility] OR;  Service: Orthopedics;  Laterality: Right;   LEFT HEART CATH AND CORONARY ANGIOGRAPHY N/A 06/03/2022   Procedure: LEFT HEART CATH AND CORONARY ANGIOGRAPHY;  Surgeon: Swaziland, Peter M, MD;  Location: [facility] INVASIVE CV LAB;  Service: Cardiovascular;  Laterality: N/A;   left inguinal hernia repair Left    2024    Current Medications: Current Meds  Medication Sig   acetaminophen  (TYLENOL ) 325 MG tablet Take 2 tablets (650 mg total) by mouth every 6 (six) hours as needed for mild pain, headache or fever.   aspirin  EC 81 MG tablet Take  1 tablet (81 mg total) by mouth daily. Swallow whole.   atorvastatin  (LIPITOR) 80 MG tablet Take 1 tablet (80 mg total) by mouth daily.   clopidogrel  (PLAVIX ) 75 MG tablet Take 1 tablet (75 mg total) by mouth daily.   Cyanocobalamin 1000 MCG TBCR Take 1  tablet by mouth daily.   ferrous sulfate  325 (65 FE) MG EC tablet Take 1 tablet (325 mg total) by mouth daily with breakfast.   levothyroxine  (SYNTHROID ) 25 MCG tablet Take 2.5 tablets (62.5 mcg total) by mouth every morning. (Patient taking differently: Take 65 mcg by mouth every morning.)   Multiple Vitamin (MULTIVITAMIN WITH MINERALS) TABS tablet Take 1 tablet by mouth daily.   nitroGLYCERIN  (NITROSTAT ) 0.4 MG SL tablet Place 1 tablet (0.4 mg total) under the tongue every 5 (five) minutes x 3 doses as needed for chest pain.   pantoprazole  (PROTONIX ) 40 MG tablet Take 40 mg by mouth daily as needed (Indigestion).     Allergies:   Patient has no known allergies.   Social History   Socioeconomic History   Marital status: Married    Spouse name: Not on file   Number of children: Not on file   Years of education: Not on file   Highest education level: Not on file  Occupational History   Not on file  Tobacco Use   Smoking status: Former    Types: Cigarettes   Smokeless tobacco: Never   Tobacco comments:    Quit in 1977, smoked a total of 12 years  Substance and Sexual Activity   Alcohol use: Never   Drug use: Never   Sexual activity: Not on file  Other Topics Concern   Not on file  Social History Narrative   Not on file   Social Drivers of Health   Financial Resource Strain: Not on file  Food Insecurity: Not on file  Transportation Needs: Not on file  Physical Activity: Not on file  Stress: Not on file  Social Connections: Not on file     Family History: The patient's family history includes Atrial fibrillation in his sister; Dementia in his father; Heart failure in his mother. ROS:   Please see the history of present illness.    All 14 point review of systems negative except as described per history of present illness  EKGs/Labs/Other Studies Reviewed:    EKG Interpretation Date/Time:  Wednesday June 07 2023 13:19:22 EDT Ventricular Rate:  70 PR  Interval:  164 QRS Duration:  84 QT Interval:  388 QTC Calculation: 419 R Axis:   -34  Text Interpretation: Normal sinus rhythm Left axis deviation Abnormal ECG When compared with ECG of 02-Sep-2022 11:19, No significant change was found Confirmed by Ralene Burger [phone]) on 06/07/2023 1:28:43 PM    Recent Labs: 08/10/2022: ALT 25 08/17/2022: Magnesium 2.2 08/18/2022: TSH 8.861 09/02/2022: BUN 20; Creatinine, Ser 1.09; Hemoglobin 10.6; Platelets 624; Potassium 4.6; Sodium 140  Recent Lipid Panel    Component Value Date/Time   CHOL 101 07/18/2022 0851   TRIG 72 07/18/2022 0851   HDL 43 07/18/2022 0851   CHOLHDL 2.3 07/18/2022 0851   CHOLHDL 4.4 06/04/2022 0159   VLDL 17 06/04/2022 0159   LDLCALC 43 07/18/2022 0851    Physical Exam:    VS:  BP 134/72 (BP Location: Right Arm, Patient Position: Sitting)   Pulse 70   Ht 5\' 9"  (1.753 m)   Wt 159 lb 9.6 oz (72.4 kg)   SpO2 96%   BMI 23.57 kg/m  Wt Readings from Last 3 Encounters:  06/07/23 159 lb 9.6 oz (72.4 kg)  12/05/22 159 lb 3.2 oz (72.2 kg)  09/02/22 151 lb 12.8 oz (68.9 kg)     GEN:  Well nourished, well developed in no acute distress HEENT: Normal NECK: No JVD; No carotid bruits LYMPHATICS: No lymphadenopathy CARDIAC: RRR, no murmurs, no rubs, no gallops RESPIRATORY:  Clear to auscultation without rales, wheezing or rhonchi  ABDOMEN: Soft, non-tender, non-distended MUSCULOSKELETAL:  No edema; No deformity  SKIN: Warm and dry LOWER EXTREMITIES: no swelling NEUROLOGIC:  Alert and oriented x 3 PSYCHIATRIC:  Normal affect   ASSESSMENT:    1. Chest pain, unspecified type   2. Coronary artery disease involving native coronary artery of native heart without angina pectoris   3. Chronic systolic heart failure (HCC)   4. Ischemic cardiomyopathy   5. Mixed dyslipidemia    PLAN:    In order of problems listed above:  Chest pain with some concerning characteristics.  However completely different compared to  pain he got before I will put him on the ranolazine 5 mg twice daily to see if it improves his symptoms if not we will may consider stress test and to see if this truly coming from his heart if it is coming from the known obtuse marginal branch or something different.  Good news is that he tells me that this is different sensation. Ischemic cardiomyopathy echocardiogram showed normalization of left ventricular ejection fraction. Dyslipidemia I did review K PN which show me LDL 43 HDL 43 good control continue present management   Medication Adjustments/Labs and Tests Ordered: Current medicines are reviewed at length with the patient today.  Concerns regarding medicines are outlined above.  Orders Placed This Encounter  Procedures   EKG 12-Lead   Medication changes: No orders of the defined types were placed in this encounter.   Signed, Manfred Seed, MD, [facility] 06/07/2023 1:40 PM    [facility]

## 2023-06-21 ENCOUNTER — Telehealth: Payer: Self-pay | Admitting: Cardiology

## 2023-06-21 DIAGNOSIS — I2510 Atherosclerotic heart disease of native coronary artery without angina pectoris: Secondary | ICD-10-CM

## 2023-06-21 DIAGNOSIS — R079 Chest pain, unspecified: Secondary | ICD-10-CM

## 2023-06-21 NOTE — Telephone Encounter (Signed)
 Pt c/o medication issue:  1. Name of Medication:   ranolazine  (RANEXA ) 500 MG 12 hr tablet   2. How are you currently taking this medication (dosage and times per day)?   Patient stopped taking  3. Are you having a reaction (difficulty breathing--STAT)?   4. What is your medication issue?   Wife Lenon Radar) stated patient stopped talking the medication because patient is still having chest pain.  Wife stated at last visit a stress test was an option and wants to know if patient should re-start this medication or next steps.

## 2023-06-21 NOTE — Telephone Encounter (Signed)
 Spoke with Joyce per DPR who states that the pt did not have any change in his chest pain with Ranexa . Lenon Radar reports that he continues to have pain and gets fatigued with exertion.  Please advise  06/07/23  Chest pain with some concerning characteristics. However completely different compared to pain he got before I will put him on the ranolazine  5 mg twice daily to see if it improves his symptoms if not we will may consider stress test and to see if this truly coming from his heart if it is coming from the known obtuse marginal branch or something different. Good news is that he tells me that this is different sensation.

## 2023-06-27 ENCOUNTER — Telehealth: Payer: Self-pay | Admitting: *Deleted

## 2023-06-27 NOTE — Telephone Encounter (Signed)
 Recommendations reviewed with pt as per Dr. Karry note.  Pt verbalized understanding and had no additional questions.  Instructions as below reviewed.  The test will take approximately 3 to 4 hours to complete; you may bring reading material.  If someone comes with you to your appointment, they will need to remain in the main lobby due to limited space in the testing area.   How to prepare for your Myocardial Perfusion Test: Do not eat or drink 3 hours prior to your test, except you may have water . Do not consume products containing caffeine (regular or decaffeinated) 12 hours prior to your test. (ex: coffee, chocolate, sodas, tea). Do bring a list of your current medications with you.  If not listed below, you may take your medications as normal. Do wear comfortable clothes (no dresses or overalls) and walking shoes, tennis shoes preferred (No heels or open toe shoes are allowed). Do NOT wear cologne, perfume, aftershave, or lotions (deodorant is allowed). If these instructions are not followed, your test will have to be rescheduled.

## 2023-06-27 NOTE — Telephone Encounter (Signed)
 Pt given instructions for mpi study.

## 2023-07-04 ENCOUNTER — Ambulatory Visit: Attending: Cardiology

## 2023-07-04 DIAGNOSIS — I2510 Atherosclerotic heart disease of native coronary artery without angina pectoris: Secondary | ICD-10-CM | POA: Diagnosis not present

## 2023-07-04 DIAGNOSIS — R079 Chest pain, unspecified: Secondary | ICD-10-CM

## 2023-07-04 LAB — MYOCARDIAL PERFUSION IMAGING
Angina Index: 0
Duke Treadmill Score: 6
Estimated workload: 5.4
Exercise duration (min): 5 min
Exercise duration (sec): 30 s
LV dias vol: 108 mL (ref 62–150)
LV sys vol: 54 mL (ref 4.2–5.8)
MPHR: 144 {beats}/min
Nuc Stress EF: 50 %
Peak HR: 141 {beats}/min
Percent HR: 97 %
Rest HR: 64 {beats}/min
Rest Nuclear Isotope Dose: 10.8 mCi
SDS: 3
SRS: 1
SSS: 4
ST Depression (mm): 0 mm
Stress Nuclear Isotope Dose: 31.6 mCi
TID: 1.05

## 2023-07-04 MED ORDER — TECHNETIUM TC 99M TETROFOSMIN IV KIT
10.8000 | PACK | Freq: Once | INTRAVENOUS | Status: AC | PRN
  Administered 2023-07-04: 10.8 via INTRAVENOUS

## 2023-07-04 MED ORDER — TECHNETIUM TC 99M TETROFOSMIN IV KIT
31.6000 | PACK | Freq: Once | INTRAVENOUS | Status: AC | PRN
  Administered 2023-07-04: 31.6 via INTRAVENOUS

## 2023-07-06 ENCOUNTER — Ambulatory Visit: Payer: Self-pay | Admitting: Cardiology

## 2023-07-06 ENCOUNTER — Telehealth: Payer: Self-pay

## 2023-07-06 NOTE — Telephone Encounter (Signed)
 Left message on My Chart with normal results per Dr. Vanetta Shawl note. Routed to PCP.

## 2023-07-06 NOTE — Telephone Encounter (Signed)
 Follow Up:      Patient is calling to see if his test results are ready from 07-04-23. Please call today if possible.

## 2023-07-11 ENCOUNTER — Ambulatory Visit: Admitting: Cardiology

## 2023-07-20 ENCOUNTER — Telehealth: Payer: Self-pay

## 2023-07-20 NOTE — Telephone Encounter (Signed)
 Pt viewed Stress Test results on My Chart per Dr. Vanetta Shawl note. Routed to PCP.

## 2023-12-27 ENCOUNTER — Telehealth: Payer: Self-pay | Admitting: Cardiology

## 2023-12-27 MED ORDER — ATORVASTATIN CALCIUM 80 MG PO TABS: 80.0000 mg | ORAL_TABLET | Freq: Every day | ORAL | 0 refills | Status: AC

## 2023-12-27 MED ORDER — ATORVASTATIN CALCIUM 80 MG PO TABS: 80.0000 mg | ORAL_TABLET | Freq: Every day | ORAL | 1 refills | Status: DC

## 2023-12-27 NOTE — Telephone Encounter (Signed)
 Pt's medication was sent to pt's pharmacy as requested. Confirmation received.

## 2023-12-27 NOTE — Addendum Note (Signed)
 Addended by: BETHENA MOATS R on: 12/27/2023 10:15 AM   Modules accepted: Orders

## 2023-12-27 NOTE — Telephone Encounter (Signed)
" °*  STAT* If patient is at the pharmacy, call can be transferred to refill team.   1. Which medications need to be refilled? (please list name of each medication and dose if known) atorvastatin  (LIPITOR) 80 MG tablet    2. Would you like to learn more about the convenience, safety, & potential cost savings by using the [facility]? NO    3. Are you open to using the [facility] (Type [facility]. No    4. Which pharmacy/location (including street and city if local pharmacy) is medication to be sent to? [id number] - [place], [place] - [address]     5. Do they need a 30 day or 90 day supply? 90 day   Pt is out of medication.  "
